# Patient Record
Sex: Female | Born: 1955 | Race: Black or African American | Hispanic: No | Marital: Single | Smoking: Never smoker
Health system: Southern US, Community
[De-identification: ages and names within clinical notes are randomized; demographics above are authoritative.]

## PROBLEM LIST (undated history)

## (undated) DIAGNOSIS — I10 Essential (primary) hypertension: Secondary | ICD-10-CM

## (undated) DIAGNOSIS — J45909 Unspecified asthma, uncomplicated: Secondary | ICD-10-CM

## (undated) DIAGNOSIS — G4730 Sleep apnea, unspecified: Secondary | ICD-10-CM

## (undated) DIAGNOSIS — T7840XA Allergy, unspecified, initial encounter: Secondary | ICD-10-CM

## (undated) DIAGNOSIS — K219 Gastro-esophageal reflux disease without esophagitis: Secondary | ICD-10-CM

## (undated) HISTORY — DX: Allergy, unspecified, initial encounter: T78.40XA

## (undated) HISTORY — DX: Unspecified asthma, uncomplicated: J45.909

## (undated) HISTORY — DX: Essential (primary) hypertension: I10

## (undated) HISTORY — DX: Sleep apnea, unspecified: G47.30

## (undated) HISTORY — PX: TUBAL LIGATION: SHX77

## (undated) HISTORY — PX: TONSILLECTOMY: SUR1361

## (undated) HISTORY — DX: Gastro-esophageal reflux disease without esophagitis: K21.9

---

## 1997-10-06 ENCOUNTER — Encounter: Payer: Self-pay | Admitting: Pulmonary Disease

## 1997-10-06 ENCOUNTER — Ambulatory Visit (HOSPITAL_COMMUNITY): Admission: RE | Admit: 1997-10-06 | Discharge: 1997-10-06 | Payer: Self-pay | Admitting: Pulmonary Disease

## 2003-10-12 ENCOUNTER — Emergency Department (HOSPITAL_COMMUNITY): Admission: EM | Admit: 2003-10-12 | Discharge: 2003-10-12 | Payer: Self-pay | Admitting: Emergency Medicine

## 2006-10-24 ENCOUNTER — Ambulatory Visit (HOSPITAL_COMMUNITY): Admission: RE | Admit: 2006-10-24 | Discharge: 2006-10-24 | Payer: Self-pay | Admitting: Cardiology

## 2007-01-30 ENCOUNTER — Encounter: Admission: RE | Admit: 2007-01-30 | Discharge: 2007-01-30 | Payer: Self-pay | Admitting: Cardiology

## 2007-02-10 ENCOUNTER — Encounter: Admission: RE | Admit: 2007-02-10 | Discharge: 2007-02-10 | Payer: Self-pay | Admitting: Obstetrics & Gynecology

## 2008-08-04 ENCOUNTER — Encounter: Admission: RE | Admit: 2008-08-04 | Discharge: 2008-08-04 | Payer: Self-pay | Admitting: Cardiology

## 2009-10-27 ENCOUNTER — Encounter (INDEPENDENT_AMBULATORY_CARE_PROVIDER_SITE_OTHER): Payer: Self-pay | Admitting: *Deleted

## 2009-10-30 ENCOUNTER — Ambulatory Visit: Payer: Self-pay | Admitting: Gastroenterology

## 2009-11-15 ENCOUNTER — Ambulatory Visit: Payer: Self-pay | Admitting: Gastroenterology

## 2009-11-20 ENCOUNTER — Encounter: Payer: Self-pay | Admitting: Gastroenterology

## 2010-02-27 NOTE — Letter (Signed)
Summary: [facility] Instructions  [facility]  [address] [place], [place] [address]   Phone: [phone]  Fax: [phone]       Sydney Tran    Jun 09, 1955    MRN: [id number]        Procedure Day Dorna Bloom:  [facility]  11/15/09     Arrival Time:  12:30PM     Procedure Time:  1:30PM     Location of Procedure:                    _ X_  [facility] (4th Floor)                       PREPARATION FOR COLONOSCOPY WITH MOVIPREP   Starting 5 days prior to your procedure 11/10/09 do not eat nuts, seeds, popcorn, corn, beans, peas,  salads, or any raw vegetables.  Do not take any fiber supplements (e.g. Metamucil, Citrucel, and Benefiber).  THE DAY BEFORE YOUR PROCEDURE         DATE: 11/14/09  DAY: TUESDAY  1.  Drink clear liquids the entire day-NO SOLID FOOD  2.  Do not drink anything colored red or purple.  Avoid juices with pulp.  No orange juice.  3.  Drink at least 64 oz. (8 glasses) of fluid/clear liquids during the day to prevent dehydration and help the prep work efficiently.  CLEAR LIQUIDS INCLUDE: Water Jello Ice Popsicles Tea (sugar ok, no milk/cream) Powdered fruit flavored drinks Coffee (sugar ok, no milk/cream) Gatorade Juice: apple, white grape, white cranberry  Lemonade Clear bullion, consomm, broth Carbonated beverages (any kind) Strained chicken noodle soup Hard Candy  4.  In the morning, mix first dose of MoviPrep solution:    Empty 1 Pouch A and 1 Pouch B into the disposable container    Add lukewarm drinking water to the top line of the container. Mix to dissolve    Refrigerate (mixed solution should be used within 24 hrs) 5.  Begin drinking the prep at 5:00 p.m. The MoviPrep container is divided by 4 marks.   Every 15 minutes drink the solution down to the next mark (approximately 8 oz) until the full liter is complete.   6.  Follow completed prep with 16 oz of clear liquid of your choice (Nothing red or purple).   Continue to drink clear liquids until bedtime.  7.  Before going to bed, mix second dose of MoviPrep solution:    Empty 1 Pouch A and 1 Pouch B into the disposable container    Add lukewarm drinking water to the top line of the container. Mix to dissolve    Refrigerate  THE DAY OF YOUR PROCEDURE      DATE: 11/15/09  DAY: WEDNESDAY  Beginning at 8:30AM (5 hours before procedure):         1. Every 15 minutes, drink the solution down to the next mark (approx 8 oz) until the full liter is complete.  2. Follow completed prep with 16 oz. of clear liquid of your choice.    3. You may drink clear liquids until 11:30AM (2 HOURS BEFORE PROCEDURE).   MEDICATION INSTRUCTIONS  Unless otherwise instructed, you should take regular prescription medications with a small sip of water   as early as possible the morning of your procedure.  .  Additional medication instructions: n/a         OTHER INSTRUCTIONS  You will need a responsible adult at least  56 years of age to accompany you and drive you home.   This person must remain in the waiting room during your procedure.  Wear loose fitting clothing that is easily removed.  Leave jewelry and other valuables at home.  However, you may wish to bring a book to read or  an iPod/MP3 player to listen to music as you wait for your procedure to start.  Remove all body piercing jewelry and leave at home.  Total time from sign-in until discharge is approximately 2-3 hours.  You should go home directly after your procedure and rest.  You can resume normal activities the  day after your procedure.  The day of your procedure you should not:   Drive   Make legal decisions   Operate machinery   Drink alcohol   Return to work  You will receive specific instructions about eating, activities and medications before you leave.    The above instructions have been reviewed and explained to me by   Sherren Kerns RN  October 30, 2009 10:15  AM   I fully understand and can verbalize these instructions _____________________________ Date _________

## 2010-02-27 NOTE — Procedures (Signed)
Summary: Colonoscopy  Patient: Sydney Tran Note: All result statuses are Final unless otherwise noted.  Tests: (1) Colonoscopy (COL)   COL Colonoscopy           DONE     [facility]     [address]     [place], [place]  [address]           COLONOSCOPY PROCEDURE REPORT           PATIENT:  Sydney Tran  MR#:  [id number]     BIRTHDATE:  13-Jul-1955, 54 yrs. old  GENDER:  female           ENDOSCOPIST:  Barbette Hair. Arlyce Dice, MD     Referred by:  Donia Guiles, M.D.           PROCEDURE DATE:  11/15/2009     PROCEDURE:  Colonoscopy with snare polypectomy     ASA CLASS:  Class I     INDICATIONS:  1) Routine Risk Screening           MEDICATIONS:   Fentanyl 100 mcg IV, Versed 10 mg IV           DESCRIPTION OF PROCEDURE:   After the risks benefits and     alternatives of the procedure were thoroughly explained, informed     consent was obtained.  Digital rectal exam was performed and     revealed no abnormalities.   The LB CF-[id number] endoscope     was introduced through the anus and advanced to the cecum, which     was identified by both the appendix and ileocecal valve, without     limitations.  The quality of the prep was excellent, using     MiraLax.  The instrument was then slowly withdrawn as the colon     was fully examined.     <<PROCEDUREIMAGES>>           FINDINGS:  A sessile polyp was found in the sigmoid colon. It was     3 mm in size. It was found 15 cm from the point of entry. Polyp     was snared without cautery. Retrieval was successful (see image16     and image17). snare polyp  This was otherwise a normal examination     of the colon (see image2, image3, image4, image6, image7, image10,     image11, image14, and image18).   Retroflexed views in the rectum     revealed no abnormalities.    The time to cecum =  1) 11.0     minutes. The scope was then withdrawn (time =  1) 4.0  min) from     the patient and the procedure completed.        COMPLICATIONS:  None           ENDOSCOPIC IMPRESSION:     1) 3 mm sessile polyp in the sigmoid colon     2) Otherwise normal examination     RECOMMENDATIONS:     1) If the polyp(s) removed today are proven to be adenomatous     (pre-cancerous) polyps, you will need a repeat colonoscopy in 5     years. Otherwise you should continue to follow colorectal cancer     screening guidelines for "routine risk" patients with colonoscopy     in 10 years.           REPEAT EXAM:   You will receive a letter from Dr. Arlyce Dice in  1-2     weeks, after reviewing the final pathology, with followup     recommendations.           ______________________________     Barbette Hair Arlyce Dice, MD           CC:           n.     eSIGNED:   Barbette Hair. Kaplan at 11/15/2009 02:21 PM           [name], [name], [id number]  Note: An exclamation mark (!) indicates a result that was not dispersed into the flowsheet. Document Creation Date: 11/15/2009 2:21 PM _______________________________________________________________________  (1) Order result status: Final Collection or observation date-time: 11/15/2009 14:15 Requested date-time:  Receipt date-time:  Reported date-time:  Referring Physician:   Ordering Physician: Melvia Heaps [phone]) Specimen Source:  Source: [name] Order Number: [id number] Lab site:   Appended Document: Colonoscopy     Procedures Next Due Date:    Colonoscopy: 10/2019

## 2010-02-27 NOTE — Letter (Signed)
Summary: Patient Notice-Hyperplastic Polyps  [facility]  [address] [place], [place] [address]   Phone: [phone]  Fax: [phone]        November 20, 2009 MRN: [id number]    [facility] [address] [place], [place]  [address]    Dear Ms. Babson,  I am pleased to inform you that the colon polyp(s) removed during your recent colonoscopy was (were) found to be hyperplastic.  These types of polyps are NOT pre-cancerous.  It is therefore my recommendation that you have a repeat colonoscopy examination in 10_ years for routine colorectal cancer screening.  Should you develop new or worsening symptoms of abdominal pain, bowel habit changes or bleeding from the rectum or bowels, please schedule an evaluation with either your primary care physician or with me.  Additional information/recommendations:  __No further action with gastroenterology is needed at this time.      Please follow-up with your primary care physician for your other      healthcare needs. __Please call [phone] to schedule a return visit to review      your situation.  __Please keep your follow-up visit as already scheduled.  _x_Continue treatment plan as outlined the day of your exam.  Please call us if you are having persistent problems or have questions about your condition that have not been fully answered at this time.  Sincerely,  Louis Meckel MD This letter has been electronically signed by your physician.  Appended Document: Patient Notice-Hyperplastic Polyps letter mailed

## 2010-02-27 NOTE — Miscellaneous (Signed)
Summary: previsit prep/rm  Clinical Lists Changes  Medications: Added new medication of MOVIPREP 100 GM  SOLR (PEG-KCL-NACL-NASULF-NA ASC-C) As per prep instructions. - Signed Rx of MOVIPREP 100 GM  SOLR (PEG-KCL-NACL-NASULF-NA ASC-C) As per prep instructions.;  #1 x 0;  Signed;  Entered by: Sherren Kerns RN;  Authorized by: Louis Meckel MD;  Method used: Electronically to [facility] [address] [place], [place], [place]  [address], Ph: [phone], Fax: [phone] Observations: Added new observation of ALLERGY REV: Done (10/30/2009 9:59) Added new observation of NKA: T (10/30/2009 9:59)    Prescriptions: MOVIPREP 100 GM  SOLR (PEG-KCL-NACL-NASULF-NA ASC-C) As per prep instructions.  #1 x 0   Entered by:   Sherren Kerns RN   Authorized by:   Louis Meckel MD   Signed by:   Sherren Kerns RN on 10/30/2009   Method used:   Electronically to        [facility] (retail)       [address]       [place], [place]  [address]       Ph: [phone]       Fax: [phone]   RxID:   [id number]

## 2011-04-16 ENCOUNTER — Encounter (INDEPENDENT_AMBULATORY_CARE_PROVIDER_SITE_OTHER): Payer: Managed Care, Other (non HMO) | Admitting: Obstetrics and Gynecology

## 2011-04-16 DIAGNOSIS — Z01419 Encounter for gynecological examination (general) (routine) without abnormal findings: Secondary | ICD-10-CM

## 2011-04-16 DIAGNOSIS — N390 Urinary tract infection, site not specified: Secondary | ICD-10-CM

## 2011-05-03 ENCOUNTER — Other Ambulatory Visit (INDEPENDENT_AMBULATORY_CARE_PROVIDER_SITE_OTHER): Payer: Managed Care, Other (non HMO)

## 2011-05-03 DIAGNOSIS — N390 Urinary tract infection, site not specified: Secondary | ICD-10-CM

## 2011-06-02 ENCOUNTER — Emergency Department (INDEPENDENT_AMBULATORY_CARE_PROVIDER_SITE_OTHER)
Admission: EM | Admit: 2011-06-02 | Discharge: 2011-06-02 | Disposition: A | Discharge status: Home / Self Care | Payer: Worker's Compensation | Source: Home / Self Care | Attending: Emergency Medicine | Admitting: Emergency Medicine

## 2011-06-02 ENCOUNTER — Encounter (HOSPITAL_COMMUNITY): Payer: Self-pay | Admitting: *Deleted

## 2011-06-02 DIAGNOSIS — Z579 Occupational exposure to unspecified risk factor: Secondary | ICD-10-CM

## 2011-06-02 DIAGNOSIS — Z569 Unspecified problems related to employment: Secondary | ICD-10-CM | POA: Diagnosis not present

## 2011-06-02 LAB — CBC
HCT: 42.7 % (ref 36.0–46.0)
MCHC: 34.2 g/dL (ref 30.0–36.0)
Platelets: 244 10*3/uL (ref 150–400)
RDW: 13.5 % (ref 11.5–15.5)
WBC: 6.2 10*3/uL (ref 4.0–10.5)

## 2011-06-02 LAB — POCT I-STAT, CHEM 8
BUN: 11 mg/dL (ref 6–23)
Chloride: 107 mEq/L (ref 96–112)
HCT: 43 % (ref 36.0–46.0)
Potassium: 3.9 mEq/L (ref 3.5–5.1)
Sodium: 141 mEq/L (ref 135–145)

## 2011-06-02 LAB — DIFFERENTIAL
Basophils Absolute: 0.1 10*3/uL (ref 0.0–0.1)
Basophils Relative: 1 % (ref 0–1)
Eosinophils Relative: 5 % (ref 0–5)
Lymphocytes Relative: 40 % (ref 12–46)
Monocytes Absolute: 0.5 10*3/uL (ref 0.1–1.0)
Neutro Abs: 3 10*3/uL (ref 1.7–7.7)

## 2011-06-02 NOTE — ED Provider Notes (Signed)
History     CSN: [id number]  Arrival date & time 06/02/11  1518   First MD Initiated Contact with Patient 06/02/11 1518      Chief Complaint  Patient presents with  . Laceration    (Consider location/radiation/quality/duration/timing/severity/associated sxs/prior treatment) HPI Comments: Patient sustained a laceration in the dorsal aspect of her left index finger about an hour ago while she was working. 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 She bled and immediately and irrigated her wound rapidly.  Has some throbbing discomfort in the distal index finger. Denies any distal weakness or numbness.  She was informed to be evaluated as at this particular point the corpus of the person she was working at this point she has no specific information or details about past medical history of this individual she was working on.  During evaluation a representative of her workplace came in with further information of the stating that at this point in time they know the patient is a known substance abuse individual that you're still in the [facility] and to obtain more information and they're working on confidentiality regulations and issues.  Patient is a 56 y.o. female presenting with skin laceration. The history is provided by the patient.  Laceration  The incident occurred less than 1 hour ago. The laceration is 1 cm in size. The laceration mechanism was a a clean knife. The pain is at a severity of 2/10. The pain is mild. She reports no foreign bodies present. Her tetanus status is unknown.    History reviewed. No pertinent past medical history.  History reviewed. No pertinent past surgical history.  History reviewed. No pertinent family history.  History  Substance Use Topics  . Smoking status: Not on file  . Smokeless tobacco: Not on file  .  Alcohol Use: Not on file    OB History    Grav Para Term Preterm Abortions TAB SAB Ect Mult Living                  Review of Systems  Constitutional: Negative for fever and appetite change.  Skin: Positive for wound.  Neurological: Negative for weakness and numbness.    Allergies  Review of patient's allergies indicates no known allergies.  Home Medications  No current outpatient prescriptions on file.  There were no vitals taken for this visit.  Physical Exam  Nursing note and vitals reviewed. Constitutional: She appears well-developed and well-nourished. No distress.  Eyes: Conjunctivae are normal.  Musculoskeletal:       Hands: Neurological: She is alert. No cranial nerve deficit.  Skin: No rash noted.    ED Course  Procedures (including critical care time)  Labs Reviewed  POCT I-STAT, CHEM 8 - Abnormal; Notable for the following:    Glucose, Bld 104 (*)    All other components within normal limits  CBC  DIFFERENTIAL  HEPATITIS PANEL, ACUTE  HIV ANTIBODY (ROUTINE TESTING)   No results found.   1. Occupational exposure in workplace       MDM  Patient sustained a laceration in the dorsal aspect of her left index finger about an hour ago while she was working. 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 She bled and immediately and irrigated her wound rapidly.    At this point in time we have obtained laboratory Baseline information as to determine if the patient has been exposed in the past for hepatitis C hepatitis B and HIV. She agreed to be tested and denies that she has been tested as long as she can remember. We discussed at length current recommendations about post exposure prophylaxis for HIV she declined to be treated at this point in time we advised her that typically most of the evidence points of been treated peripherally  within the first 36 hours patient decided to wait and will think about it tomorrow. Patient is aware that further testing is needed to occur at different intervals from today 3 weeks 6 weeks 3 months and 6 months to detect any potential circumflex person. She has discussed this issues with her management they will try to obtain further information as to determine if the patient is a known HIV infected or hepatitis C infected individual.    Patient instructed to call us tomorrow or return we will discuss this further and we will figure to our employee health for continuity of this post exposure. Vision 2 guidelines with her to read in detail   Jimmie Molly, MD 06/02/11 1958

## 2011-06-02 NOTE — Discharge Instructions (Signed)
We discuss current guidelines for post exposure prophylaxis for HIV. You opted to decline treatment at this point and will study parameters given to you take a decision in the morning. We also discussed important to try to make any necessary effort to obtain medical records from the source of exposure. We also discussed the need for you to be retested at specific intervals of time as described in provided to your

## 2011-06-02 NOTE — ED Notes (Signed)
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 approx 1/2 inch lac to finger

## 2011-06-03 ENCOUNTER — Telehealth (HOSPITAL_COMMUNITY): Payer: Self-pay | Admitting: *Deleted

## 2011-06-03 LAB — HEPATITIS PANEL, ACUTE
HCV Ab: NEGATIVE
Hep B C IgM: NEGATIVE
Hepatitis B Surface Ag: NEGATIVE

## 2011-06-03 LAB — HIV ANTIBODY (ROUTINE TESTING W REFLEX): HIV: NONREACTIVE

## 2011-06-03 NOTE — ED Notes (Addendum)
Pt. called and said she was supposed to call Dr. Ladon Applebaum back for a treatment plan.  Dr. Ladon Applebaum talked to pt. on the phone. He told me she does not want to start the HIV preventive medication yet. She is waiting for the medical examiner to tell her if the deceased has HIV.  She will need to f/u with Occupational Health.  He will leave her Rx.'s if she calls back after 1600. Vassie Moselle 06/03/2011

## 2011-06-03 NOTE — ED Notes (Signed)
Call from patient on answering machine, after examining chart, pt has found to have discussed issue with phone nurse, Cherly Anderson

## 2011-06-04 NOTE — ED Notes (Addendum)
Pt. came in for her lab results. Pt. verified with her picture ID and shown her neg. results. Acute Hepatitis panel all neg, HIV non-reactive. Pt. does not want to take the preventative drugs at this time because the medical examiner told her initial HIV screening of deceased was neg.  She was told the blood had degraded to much to do the Hepatitis testing. She was told by a family member that he had Hepatitis- did not know which one. Pt. is anxious and has not read her d/c instructions and could not remember what Dr. Ladon Applebaum told her about f/u testing. She also wants to know if there is preventive medication for Hepatitis.  She told me she has had the Hepatitis B vaccine. Her PCP is Dr. Shana Chute but he is out on leave. Has appointment today with Dr. Algie Coffer at 1500. She asked if she could keep seeing Dr. Ladon Applebaum. I told her no, he did the initial screening but she needs to f/u with PCP. I called Dr. Ladon Applebaum and left a message, to ask him about her other concerns. He called me back in 5 minutes.  He said pt. really needs to f/u with Employee/Occ. [facility] that her company uses.  They can do all the f/u testing and know exactly what to do.  He said to reassure pt. -don't worry, she will be fine.  He said the main concern is for HIV and that was neg., so she does not need the preventive drugs. She does need retested in 3 weeks, 3 mos., and 6 mos. and it can be done by [facility] [facility]. He suggested [facility] try to contact deceased's physician to find out what kind of Hepatitis he had. I gave all this information.  Pt.'s questions answered. Pt. reassured. Pt. wants copies of her labs. Pt. signed medical release form while I copied her picture ID. Pt. given copies of labs in a brown envelope. I told her if she decides to see Dr. Algie Coffer today to show him the labs. Vassie Moselle 06/04/2011 Pt. does not know which [facility] [facility] her company uses. Priscella Mann RN.

## 2013-07-13 ENCOUNTER — Other Ambulatory Visit: Payer: Self-pay | Admitting: Obstetrics and Gynecology

## 2013-07-13 DIAGNOSIS — Z1231 Encounter for screening mammogram for malignant neoplasm of breast: Secondary | ICD-10-CM

## 2013-07-22 ENCOUNTER — Ambulatory Visit
Admission: RE | Admit: 2013-07-22 | Discharge: 2013-07-22 | Disposition: A | Discharge status: Home / Self Care | Payer: Managed Care, Other (non HMO) | Source: Ambulatory Visit | Attending: Obstetrics and Gynecology | Admitting: Obstetrics and Gynecology

## 2013-07-22 DIAGNOSIS — Z1231 Encounter for screening mammogram for malignant neoplasm of breast: Secondary | ICD-10-CM

## 2013-10-22 ENCOUNTER — Encounter: Payer: Self-pay | Admitting: Gastroenterology

## 2014-01-04 ENCOUNTER — Other Ambulatory Visit: Payer: Self-pay | Admitting: Cardiology

## 2014-01-04 DIAGNOSIS — R1011 Right upper quadrant pain: Secondary | ICD-10-CM

## 2014-01-06 ENCOUNTER — Other Ambulatory Visit: Payer: Self-pay | Admitting: Cardiology

## 2014-01-06 ENCOUNTER — Ambulatory Visit
Admission: RE | Admit: 2014-01-06 | Discharge: 2014-01-06 | Disposition: A | Discharge status: Home / Self Care | Payer: Managed Care, Other (non HMO) | Source: Ambulatory Visit | Attending: Cardiology | Admitting: Cardiology

## 2014-01-06 DIAGNOSIS — R1011 Right upper quadrant pain: Secondary | ICD-10-CM

## 2016-02-20 ENCOUNTER — Ambulatory Visit
Admission: RE | Admit: 2016-02-20 | Discharge: 2016-02-20 | Disposition: A | Discharge status: Home / Self Care | Payer: Managed Care, Other (non HMO) | Source: Ambulatory Visit | Attending: Cardiology | Admitting: Cardiology

## 2016-02-20 ENCOUNTER — Other Ambulatory Visit: Payer: Self-pay | Admitting: Cardiology

## 2016-02-20 DIAGNOSIS — R0600 Dyspnea, unspecified: Secondary | ICD-10-CM

## 2016-03-15 ENCOUNTER — Ambulatory Visit (INDEPENDENT_AMBULATORY_CARE_PROVIDER_SITE_OTHER): Payer: Managed Care, Other (non HMO) | Admitting: Internal Medicine

## 2016-03-15 ENCOUNTER — Encounter: Payer: Self-pay | Admitting: Internal Medicine

## 2016-03-15 VITALS — BP 122/78 | HR 69

## 2016-03-15 DIAGNOSIS — R05 Cough: Secondary | ICD-10-CM | POA: Diagnosis not present

## 2016-03-15 DIAGNOSIS — R058 Other specified cough: Secondary | ICD-10-CM

## 2016-03-15 MED ORDER — FAMOTIDINE 20 MG PO TABS: ORAL_TABLET | ORAL | 2 refills | Status: DC

## 2016-03-15 MED ORDER — BENZONATATE 200 MG PO CAPS: 200.0000 mg | ORAL_CAPSULE | Freq: Three times a day (TID) | ORAL | 1 refills | Status: DC | PRN

## 2016-03-15 MED ORDER — PANTOPRAZOLE SODIUM 40 MG PO TBEC: 40.0000 mg | DELAYED_RELEASE_TABLET | Freq: Every day | ORAL | 2 refills | Status: DC

## 2016-03-15 NOTE — Patient Instructions (Addendum)
Pantoprazole (protonix) 40 mg   Take  30-60 min before first meal of the day and Pepcid (famotidine)  20 mg one @  bedtime until return to office - this is the best way to tell whether stomach acid is contributing to your problem.    GERD (REFLUX)  is an extremely common cause of respiratory symptoms just like yours , many times with no obvious heartburn at all.    It can be treated with medication, but also with lifestyle changes including elevation of the head of your bed (ideally with 6 inch  bed blocks),  Smoking cessation, avoidance of late meals, excessive alcohol, and avoid fatty foods, chocolate, peppermint, colas, red wine, and acidic juices such as orange juice.  NO MINT OR MENTHOL PRODUCTS SO NO COUGH DROPS   USE SUGARLESS CANDY INSTEAD (Jolley ranchers or Stover's or Life Savers) or even ice chips will also do - the key is to swallow to prevent all throat clearing. NO OIL BASED VITAMINS - use powdered substitutes.  For cough > tessilon 200 mg three times daily as needed     Please schedule a follow up office visit in 4 weeks, sooner if needed with all meds in hand

## 2016-03-15 NOTE — Progress Notes (Signed)
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 Pulmonary Consult    referred by Dr. Shana Chute, started in January, went to a urgent care for treatment for URI and bronchitis, it helped but still didnt go away completely, xray showed air in her lungs, dyspnea when coughing, her voice gets hoarse  sleeps ok/ feels rested in am no excess mucus but during the day doe  X steps Assoc with hoarseness and loss of breath at rest x few secs at rest then resolves "like getting choked"  No obvious day to day or daytime variability or assoc excess/ purulent sputum or mucus plugs or hemoptysis or cp or chest tightness, subjective wheeze or overt sinus or hb symptoms. No unusual exp hx or h/o childhood pna/ asthma or knowledge of premature birth.  Sleeping ok without nocturnal  or early am exacerbation  of respiratory  c/o's or need for noct saba. Also denies any obvious fluctuation of symptoms with weather or environmental changes or other aggravating or alleviating factors except as outlined above   Current Medications, Allergies, Complete Past Medical History, Past Surgical History, Family History, and Social History were reviewed in [employer] record.          Review of Systems  Constitutional: Negative.  Negative for fever and unexpected weight change.  HENT: Negative.  Negative for congestion, dental problem, ear pain, nosebleeds, postnasal drip, rhinorrhea, sinus pressure,  sneezing, sore throat and trouble swallowing.   Eyes: Negative.  Negative for redness and itching.  Respiratory: Positive for cough and shortness of breath. Negative for chest tightness and wheezing.   Cardiovascular: Negative.  Negative for palpitations and leg swelling.  Gastrointestinal: Negative.  Negative for nausea and vomiting.  Genitourinary: Negative.  Negative for dysuria.  Musculoskeletal: Negative.  Negative for joint swelling.  Skin: Negative.  Negative for rash.  Neurological: Negative.  Negative for headaches.  Hematological: Negative.  Does not bruise/bleed easily.  Psychiatric/Behavioral: Negative.  Negative for dysphoric mood. The patient is not nervous/anxious.        Objective:   Physical Exam  Very hoarse amb wf with freq throat clearing  Wt Readings from Last 3 Encounters:  No data found for Wt    Vital signs reviewed  - Note on arrival 02 sats  95% on RA    HEENT: nl dentition, turbinates bilaterally, and oropharynx. Nl external ear canals without cough reflex   NECK :  without JVD/Nodes/TM/ nl carotid upstrokes bilaterally   LUNGS: no acc muscle use,  Nl contour chest which is clear to A and P bilaterally without cough on insp or exp maneuvers   CV:  RRR  no s3 or murmur or increase in P2, and no edema   ABD:  soft and nontender with nl inspiratory excursion in the supine position. No bruits or organomegaly appreciated, bowel sounds nl  MS:  Nl gait/ ext warm without deformities, calf tenderness, cyanosis or clubbing No obvious joint restrictions   SKIN: warm and dry without lesions    NEURO:  alert, approp, nl sensorium with  no motor or cerebellar deficits apparent.      I personally reviewed images and agree with radiology impression as follows:  CXR:   02/20/16  Hyperinflation consistent with COPD or reactive airway disease. There is no pneumonia nor other acute cardiopulmonary abnormality.         Assessment & Plan:

## 2016-03-17 ENCOUNTER — Encounter: Payer: Self-pay | Admitting: Internal Medicine

## 2016-03-17 NOTE — Assessment & Plan Note (Signed)
Spirometry 03/15/2016  wnl including f/v contour off rx  - 03/15/2016 max gerd rx >>>  The most common causes of chronic cough in immunocompetent adults include the following: upper airway cough syndrome (UACS), previously referred to as postnasal drip syndrome (PNDS), which is caused by variety of rhinosinus conditions; (2) asthma; (3) GERD; (4) chronic bronchitis from cigarette smoking or other inhaled environmental irritants; (5) nonasthmatic eosinophilic bronchitis; and (6) bronchiectasis.   These conditions, singly or in combination, have accounted for up to 94% of the causes of chronic cough in prospective studies.   Other conditions have constituted no >6% of the causes in prospective studies These have included bronchogenic carcinoma, chronic interstitial pneumonia, sarcoidosis, left ventricular failure, ACEI-induced cough, and aspiration from a condition associated with pharyngeal dysfunction.    Chronic cough is often simultaneously caused by more than one condition. A single cause has been found from 38 to 82% of the time, multiple causes from 18 to 62%. Multiply caused cough has been the result of three diseases up to 42% of the time.      Most likely this is Upper airway cough syndrome (previously labeled PNDS) , is  so named because it's frequently impossible to sort out how much is  CR/sinusitis with freq throat clearing (which can be related to primary GERD)   vs  causing  secondary (" extra esophageal")  GERD from wide swings in gastric pressure that occur with throat clearing, often  promoting self use of mint and menthol lozenges that reduce the lower esophageal sphincter tone and exacerbate the problem further in a cyclical fashion.   These are the same pts (now being labeled as having "irritable larynx syndrome" by some cough centers) who not infrequently have a history of having failed to tolerate ace inhibitors,  dry powder inhalers or biphosphonates or report having  atypical/extraesophageal reflux symptoms that don't respond to standard doses of PPI  and are easily confused as having aecopd or asthma flares by even experienced allergists/ pulmonologists (myself included).  Of the three most common causes of chronic cough, only one (GERD)  can actually cause the other two (asthma and post nasal drip syndrome)  and perpetuate the cylce of cough inducing airway trauma, inflammation, heightened sensitivity to reflux which is prompted by the cough itself via a cyclical mechanism.    This may partially respond to steroids and look like asthma and post nasal drainage but never erradicated completely unless the cough and the secondary reflux are eliminated, preferably both at the same time.  While not intuitively obvious, many patients with chronic low grade reflux do not cough until there is a secondary insult that disturbs the protective epithelial barrier and exposes sensitive nerve endings.  This can be viral as was likely the scenario here  or direct physical injury such as with an endotracheal tube.   The point is that once this occurs, it is difficult to eliminate using anything but a maximally effective acid suppression regimen at least in the short run, accompanied by an appropriate diet to address non acid GERD.  Will try max rx for gerd and try to eliminate throat clearing with hard candy/ tessalon   Total time devoted to counseling  > 50 % of initial 60 min office visit:  review case with pt/ discussion of options/alternatives/ personally creating written customized instructions  in presence of pt  then going over those specific  Instructions directly with the pt including how to use all of the meds but  in particular covering each new medication in detail and the difference between the maintenance= "automatic" meds and the prns using an action plan format for the latter (If this problem/symptom => do that organization reading Left to right).  Please see AVS from  this visit for a full list of these instructions which I personally wrote for this pt and  are unique to this visit.

## 2016-04-12 ENCOUNTER — Telehealth: Payer: Self-pay | Admitting: Internal Medicine

## 2016-04-12 ENCOUNTER — Encounter: Payer: Self-pay | Admitting: Internal Medicine

## 2016-04-12 ENCOUNTER — Other Ambulatory Visit: Payer: Self-pay | Admitting: Internal Medicine

## 2016-04-12 ENCOUNTER — Ambulatory Visit (INDEPENDENT_AMBULATORY_CARE_PROVIDER_SITE_OTHER): Payer: Managed Care, Other (non HMO) | Admitting: Internal Medicine

## 2016-04-12 ENCOUNTER — Other Ambulatory Visit (INDEPENDENT_AMBULATORY_CARE_PROVIDER_SITE_OTHER): Payer: Managed Care, Other (non HMO)

## 2016-04-12 VITALS — BP 126/72 | HR 66 | Ht 66.0 in | Wt 185.0 lb

## 2016-04-12 DIAGNOSIS — R0609 Other forms of dyspnea: Secondary | ICD-10-CM | POA: Diagnosis not present

## 2016-04-12 DIAGNOSIS — R058 Other specified cough: Secondary | ICD-10-CM

## 2016-04-12 DIAGNOSIS — R05 Cough: Secondary | ICD-10-CM | POA: Diagnosis not present

## 2016-04-12 LAB — CBC WITH DIFFERENTIAL/PLATELET
BASOS PCT: 2.1 % (ref 0.0–3.0)
Basophils Absolute: 0.1 10*3/uL (ref 0.0–0.1)
EOS ABS: 0.2 10*3/uL (ref 0.0–0.7)
EOS PCT: 4.6 % (ref 0.0–5.0)
HCT: 41.6 % (ref 36.0–46.0)
HEMOGLOBIN: 14 g/dL (ref 12.0–15.0)
LYMPHS PCT: 37.5 % (ref 12.0–46.0)
Lymphs Abs: 1.7 10*3/uL (ref 0.7–4.0)
MCHC: 33.8 g/dL (ref 30.0–36.0)
MCV: 86.3 fl (ref 78.0–100.0)
MONOS PCT: 10.5 % (ref 3.0–12.0)
Monocytes Absolute: 0.5 10*3/uL (ref 0.1–1.0)
NEUTROS PCT: 45.3 % (ref 43.0–77.0)
Neutro Abs: 2 10*3/uL (ref 1.4–7.7)
Platelets: 266 10*3/uL (ref 150.0–400.0)
RBC: 4.82 Mil/uL (ref 3.87–5.11)
RDW: 14 % (ref 11.5–15.5)
WBC: 4.4 10*3/uL (ref 4.0–10.5)

## 2016-04-12 LAB — NITRIC OXIDE: Nitric Oxide: 10

## 2016-04-12 MED ORDER — GABAPENTIN 100 MG PO CAPS: 100.0000 mg | ORAL_CAPSULE | Freq: Three times a day (TID) | ORAL | 2 refills | Status: DC

## 2016-04-12 MED ORDER — PREDNISONE 10 MG PO TABS: ORAL_TABLET | ORAL | 0 refills | Status: DC

## 2016-04-12 NOTE — Patient Instructions (Addendum)
Continue Pantoprazole (protonix) 40 mg   Take  30-60 min before first meal of the day and Pepcid (famotidine)  20 mg one @  bedtime until return to office - this is the best way to tell whether stomach acid is contributing to your problem.    Prednisone 10 mg take  4 each am x 2 days,   2 each am x 2 days,  1 each am x 2 days and stop and if not resolved start  gabapentin 100 mg three times a day with meals   GERD (REFLUX)  is an extremely common cause of respiratory symptoms just like yours , many times with no obvious heartburn at all.    It can be treated with medication, but also with lifestyle changes including elevation of the head of your bed (ideally with 6 inch  bed blocks),  Smoking cessation, avoidance of late meals, excessive alcohol, and avoid fatty foods, chocolate, peppermint, colas, red wine, and acidic juices such as orange juice.  NO MINT OR MENTHOL PRODUCTS SO NO COUGH DROPS   USE SUGARLESS CANDY INSTEAD (Jolley ranchers or Stover's or Life Savers) or even ice chips will also do - the key is to swallow to prevent all throat clearing. NO OIL BASED VITAMINS - use powdered substitutes.    Please remember to go to the lab   department downstairs in the basement  for your tests - we will call you with the results when they are available.  Let us know the name of the inhaler that you've been taking ASAP but  try off it for now    Please schedule a follow up office visit in 4 weeks, sooner if needed with all active meds in hand with option of referral [facility]

## 2016-04-12 NOTE — Progress Notes (Signed)
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 Pulmonary Consult    referred by Dr. Shana Tran, started in January, went to a urgent care for treatment for URI and bronchitis, it helped but still didnt go away completely, xray showed air in her lungs, dyspnea when coughing, her voice gets hoarse  sleeps ok/ feels rested in am no excess mucus but during the day doe  X steps [employer] with hoarseness and loss of breath at rest x few secs at rest then resolves "like getting choked" rec Pantoprazole (protonix) 40 mg   Take  30-60 min before first meal of the day and Pepcid (famotidine)  20 mg one @  bedtime until return to office - this is the best way to tell whether stomach acid is contributing to your problem.   GERD  For cough > tessilon 200 mg three times daily as needed  Please schedule a follow up office visit in 4 weeks, sooner if needed with all meds in hand     04/12/2016  f/u ov/Sydney Tran re: cough x 3 months - did not bring all meds as requested  Chief Complaint  Patient presents with  . Follow-up    pt reports she is not coughing as much as she was,   wakes up several times a week with a cough around 2-3 am but resolves s rx  Every day aware of cough at some point but there is not pattern she cam discern/ no definite resp to "inhaler" which she could not identify Every time she goes up steps at work  she is sob    No obvious day to day or daytime variability or assoc  excess/ purulent sputum or mucus plugs or hemoptysis or cp or chest tightness, subjective wheeze or overt sinus or hb symptoms. No unusual exp hx or h/o childhood pna/ asthma or knowledge of premature birth.  Sleeping ok without nocturnal  or early am exacerbation  of respiratory  c/o's or need for noct saba. Also denies any obvious fluctuation of symptoms with weather or environmental changes or other aggravating or alleviating factors except as outlined above   Current Medications, Allergies, Complete Past Medical History, Past Surgical History, Family History, and Social History were reviewed in [employer] record.  ROS  The following are not active complaints unless bolded sore throat, dysphagia, dental problems, itching, sneezing,  nasal congestion or excess/ purulent secretions, ear ache,   fever, chills, sweats, unintended wt loss, classically pleuritic or exertional cp,  orthopnea pnd or leg swelling, presyncope, palpitations, abdominal pain, anorexia, nausea, vomiting, diarrhea  or change in bowel or bladder habits, change in stools or urine, dysuria,hematuria,  rash, arthralgias, visual complaints, headache, numbness, weakness or ataxia or problems with walking or coordination,  change in mood/affect or memory.  Objective:   Physical Exam  Very hoarse amb bf with ongoing classic voice fatigue to point of almost sounding choked   Wt Readings from Last 3 Encounters:  04/12/16 185 lb (83.9 kg)    Vital signs reviewed  - Note on arrival 02 sats  97% on RA      HEENT: nl dentition, turbinates bilaterally, and oropharynx. Nl external ear canals without cough reflex   NECK :  without JVD/Nodes/TM/ nl carotid upstrokes bilaterally   LUNGS: no acc muscle use,  Nl contour chest which is clear to A and P bilaterally without cough on insp or exp maneuvers   CV:  RRR  no s3 or murmur or increase in P2, and no edema   ABD:  soft and  nontender with nl inspiratory excursion in the supine position. No bruits or organomegaly appreciated, bowel sounds nl  MS:  Nl gait/ ext warm without deformities, calf tenderness, cyanosis or clubbing No obvious joint restrictions   SKIN: warm and dry without lesions    NEURO:  alert, approp, nl sensorium with  no motor or cerebellar deficits apparent.      I personally reviewed images and agree with radiology impression as follows:  CXR:   02/20/16  Hyperinflation consistent with COPD or reactive airway disease. There is no pneumonia nor other acute cardiopulmonary abnormality.   Labs ordered 04/12/2016  Allergy profile       Assessment & Plan:

## 2016-04-12 NOTE — Telephone Encounter (Signed)
Per MW- pt was to call us with the name of her inhaler  LMTCB

## 2016-04-14 DIAGNOSIS — R0609 Other forms of dyspnea: Secondary | ICD-10-CM

## 2016-04-14 NOTE — Assessment & Plan Note (Signed)
04/12/2016  Walked RA x 3 laps @ 185 ft each stopped due to end of study/ very fast pace, no desat/ sob at end   Strongly suspect this is all uacs (see separate a/p)   Asked her to contact us with name of inhaler that "doesn't work" as asked her to stop it

## 2016-04-14 NOTE — Assessment & Plan Note (Addendum)
Spirometry 03/15/2016  wnl including f/v contour off rx  - 03/15/2016 max gerd rx > some better 04/12/2016   - Allergy profile 04/12/2016 >  Eos 0.2 /  IgE  Pending - FENO 04/12/2016  =   10  So rx pred x 6 days and if not better gabapentin 100 tid   Classic voice fatigue only marginally better with rx for gerd typical of Upper airway cough syndrome (previously labeled PNDS) , is  so named because it's frequently impossible to sort out how much is  CR/sinusitis with freq throat clearing (which can be related to primary GERD)   vs  causing  secondary (" extra esophageal")  GERD from wide swings in gastric pressure that occur with throat clearing, often  promoting self use of mint and menthol lozenges that reduce the lower esophageal sphincter tone and exacerbate the problem further in a cyclical fashion.   These are the same pts (now being labeled as having "irritable larynx syndrome" by some cough centers) who not infrequently have a history of having failed to tolerate ace inhibitors,  dry powder inhalers or biphosphonates or report having atypical/extraesophageal reflux symptoms that don't respond to standard doses of PPI  and are easily confused as having aecopd or asthma flares by even experienced allergists/ pulmonologists (myself included).   Strongly doubt allergy/ asthma component based on hx and low feno  but reasonable to try pred x 6 days and if not better either trial of gabapentin or [facility].

## 2016-04-15 LAB — RESPIRATORY ALLERGY PROFILE REGION II ~~LOC~~
Allergen, Cedar tree, t12: <0.1 kU/L
Allergen, Comm Silver Birch, t9: <0.1 kU/L
Allergen, Cottonwood, t14: <0.1 kU/L
Allergen, Mouse Urine Protein, e78: <0.1 kU/L
Allergen, Oak,t7: <0.1 kU/L
Allergen, P. notatum, m1: <0.1 kU/L
Aspergillus fumigatus, m3: <0.1 kU/L
Bermuda Grass: <0.1 kU/L
Cat Dander: <0.1 kU/L
Common Ragweed: <0.1 kU/L
ELM IGE: 0.13 kU/L — AB
IgE (Immunoglobulin E), Serum: 48 kU/L (ref <115)
Pecan/Hickory Tree IgE: 2.03 kU/L — ABNORMAL HIGH
Timothy Grass: <0.1 kU/L

## 2016-04-15 NOTE — Telephone Encounter (Signed)
Spoke with the pt The name of the inhaler is Flovent 110 mcg  She is asking about her lab results Please advise thanks

## 2016-04-15 NOTE — Telephone Encounter (Signed)
Spoke with the pt  She states she can not find her inhaler  She states she is going to go look in the car and then call us back

## 2016-04-16 NOTE — Telephone Encounter (Signed)
Lab results given to the pt  See result note

## 2016-04-16 NOTE — Progress Notes (Signed)
Spoke with pt and notified of results per Dr. Wert. Pt verbalized understanding and denied any questions. 

## 2016-05-10 ENCOUNTER — Ambulatory Visit: Payer: Self-pay | Admitting: Internal Medicine

## 2017-05-19 DIAGNOSIS — Z113 Encounter for screening for infections with a predominantly sexual mode of transmission: Secondary | ICD-10-CM | POA: Diagnosis not present

## 2017-05-19 DIAGNOSIS — K219 Gastro-esophageal reflux disease without esophagitis: Secondary | ICD-10-CM | POA: Diagnosis not present

## 2017-05-19 DIAGNOSIS — J040 Acute laryngitis: Secondary | ICD-10-CM | POA: Diagnosis not present

## 2017-05-19 DIAGNOSIS — Z01419 Encounter for gynecological examination (general) (routine) without abnormal findings: Secondary | ICD-10-CM | POA: Diagnosis not present

## 2017-05-20 ENCOUNTER — Ambulatory Visit (HOSPITAL_COMMUNITY)
Admission: EM | Admit: 2017-05-20 | Discharge: 2017-05-20 | Disposition: A | Discharge status: Home / Self Care | Payer: Self-pay

## 2017-11-06 DIAGNOSIS — Z23 Encounter for immunization: Secondary | ICD-10-CM | POA: Diagnosis not present

## 2017-12-08 DIAGNOSIS — R0600 Dyspnea, unspecified: Secondary | ICD-10-CM | POA: Diagnosis not present

## 2017-12-08 DIAGNOSIS — E663 Overweight: Secondary | ICD-10-CM | POA: Diagnosis not present

## 2018-01-05 DIAGNOSIS — R0600 Dyspnea, unspecified: Secondary | ICD-10-CM | POA: Diagnosis not present

## 2018-03-06 ENCOUNTER — Other Ambulatory Visit: Payer: Self-pay | Admitting: Internal Medicine

## 2018-03-06 ENCOUNTER — Ambulatory Visit
Admission: RE | Admit: 2018-03-06 | Discharge: 2018-03-06 | Disposition: A | Discharge status: Home / Self Care | Payer: BLUE CROSS/BLUE SHIELD | Source: Ambulatory Visit | Attending: Internal Medicine | Admitting: Internal Medicine

## 2018-03-06 DIAGNOSIS — M25519 Pain in unspecified shoulder: Secondary | ICD-10-CM | POA: Diagnosis not present

## 2018-03-06 DIAGNOSIS — M19011 Primary osteoarthritis, right shoulder: Secondary | ICD-10-CM | POA: Diagnosis not present

## 2018-03-06 DIAGNOSIS — M25511 Pain in right shoulder: Secondary | ICD-10-CM

## 2018-03-06 IMAGING — DX DG SHOULDER 2+V*R*
3 series · 3 of 3 positions shown · non-contrast
Comparison: None.

CLINICAL DATA: Right shoulder pain

EXAM:
RIGHT SHOULDER - 2+ VIEW

[dg shoulder right (1 of 3)]
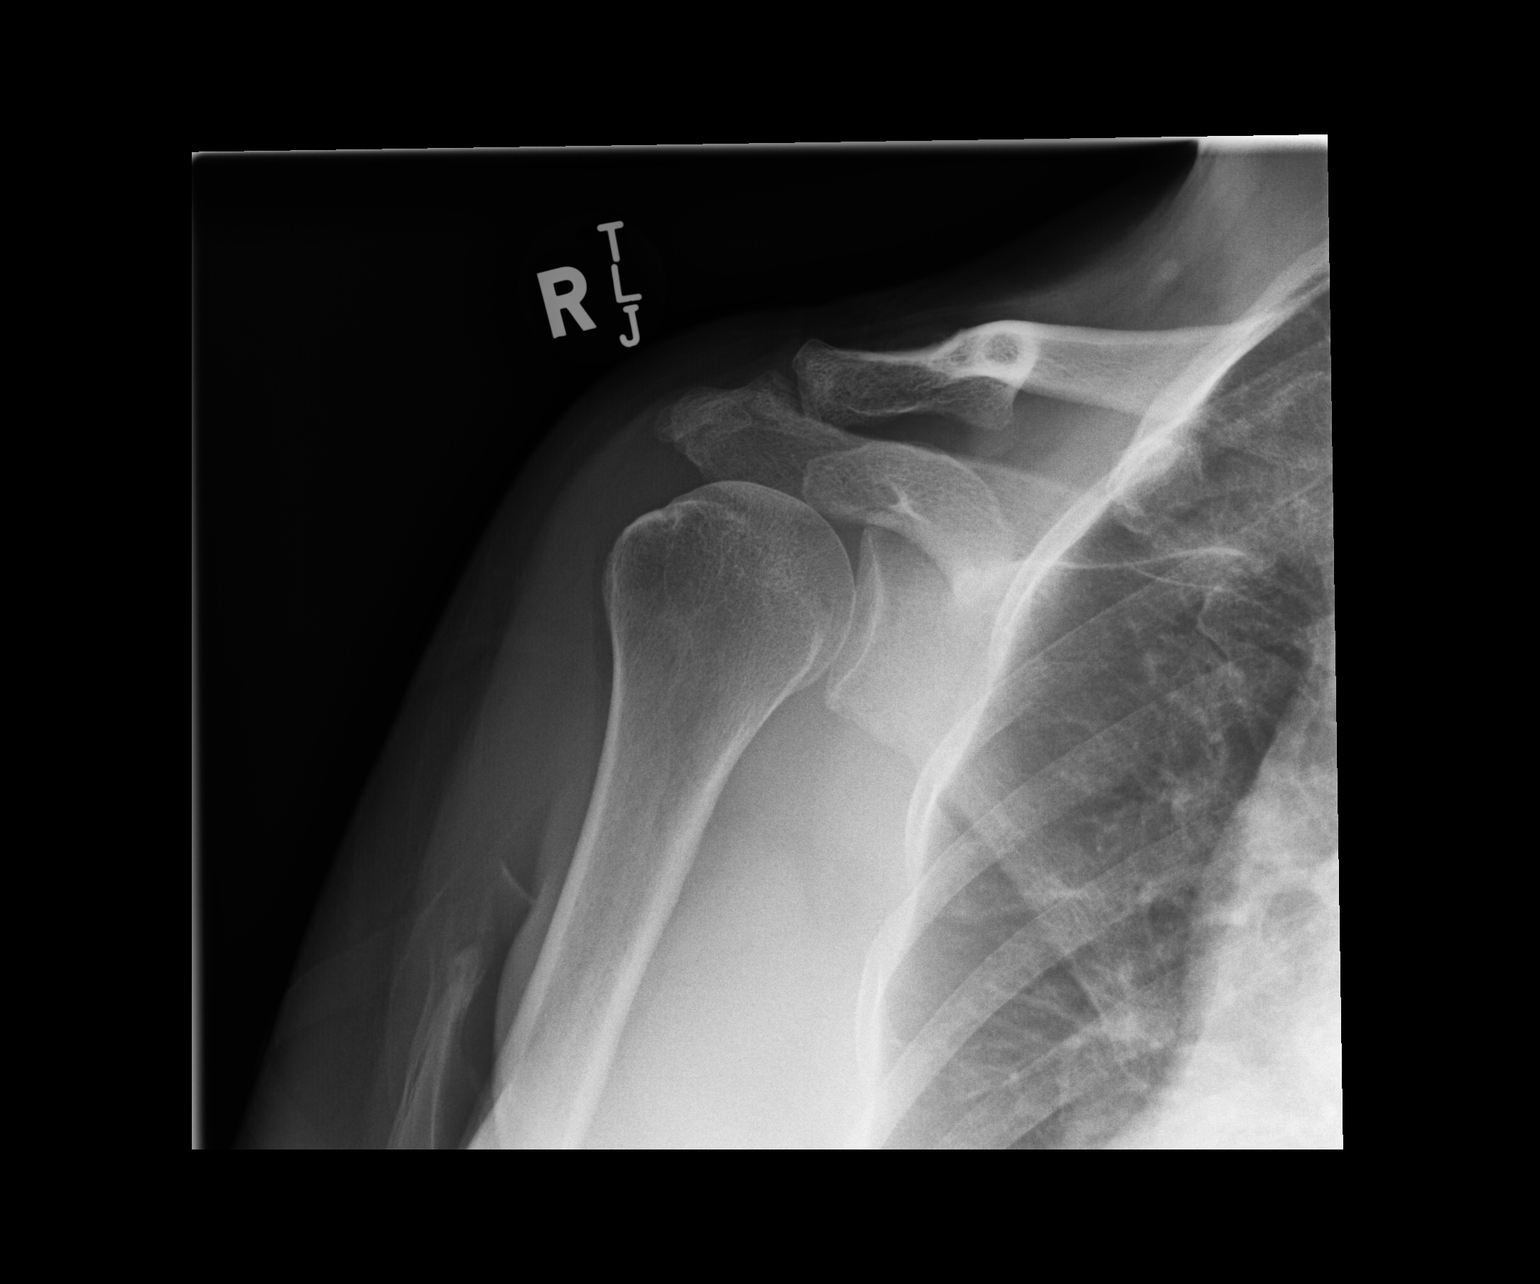

[dg shoulder right (2 of 3)]
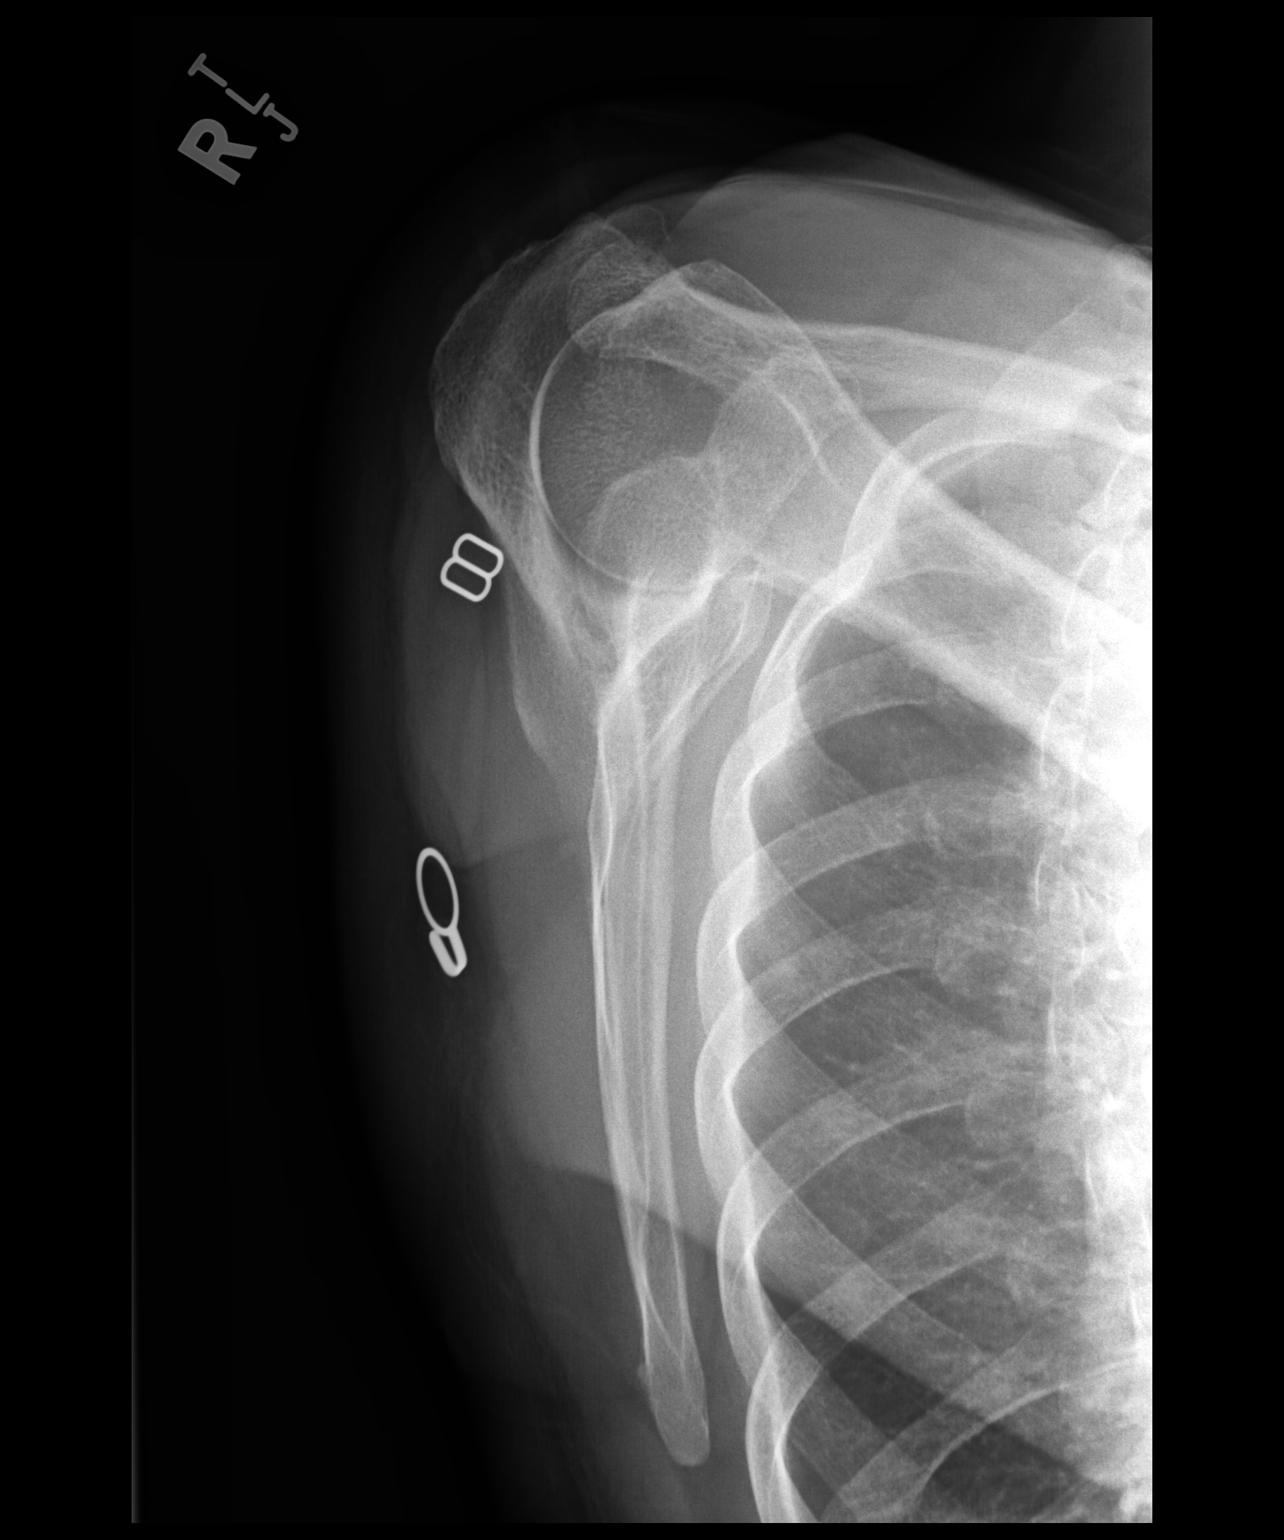

[dg shoulder right (3 of 3)]
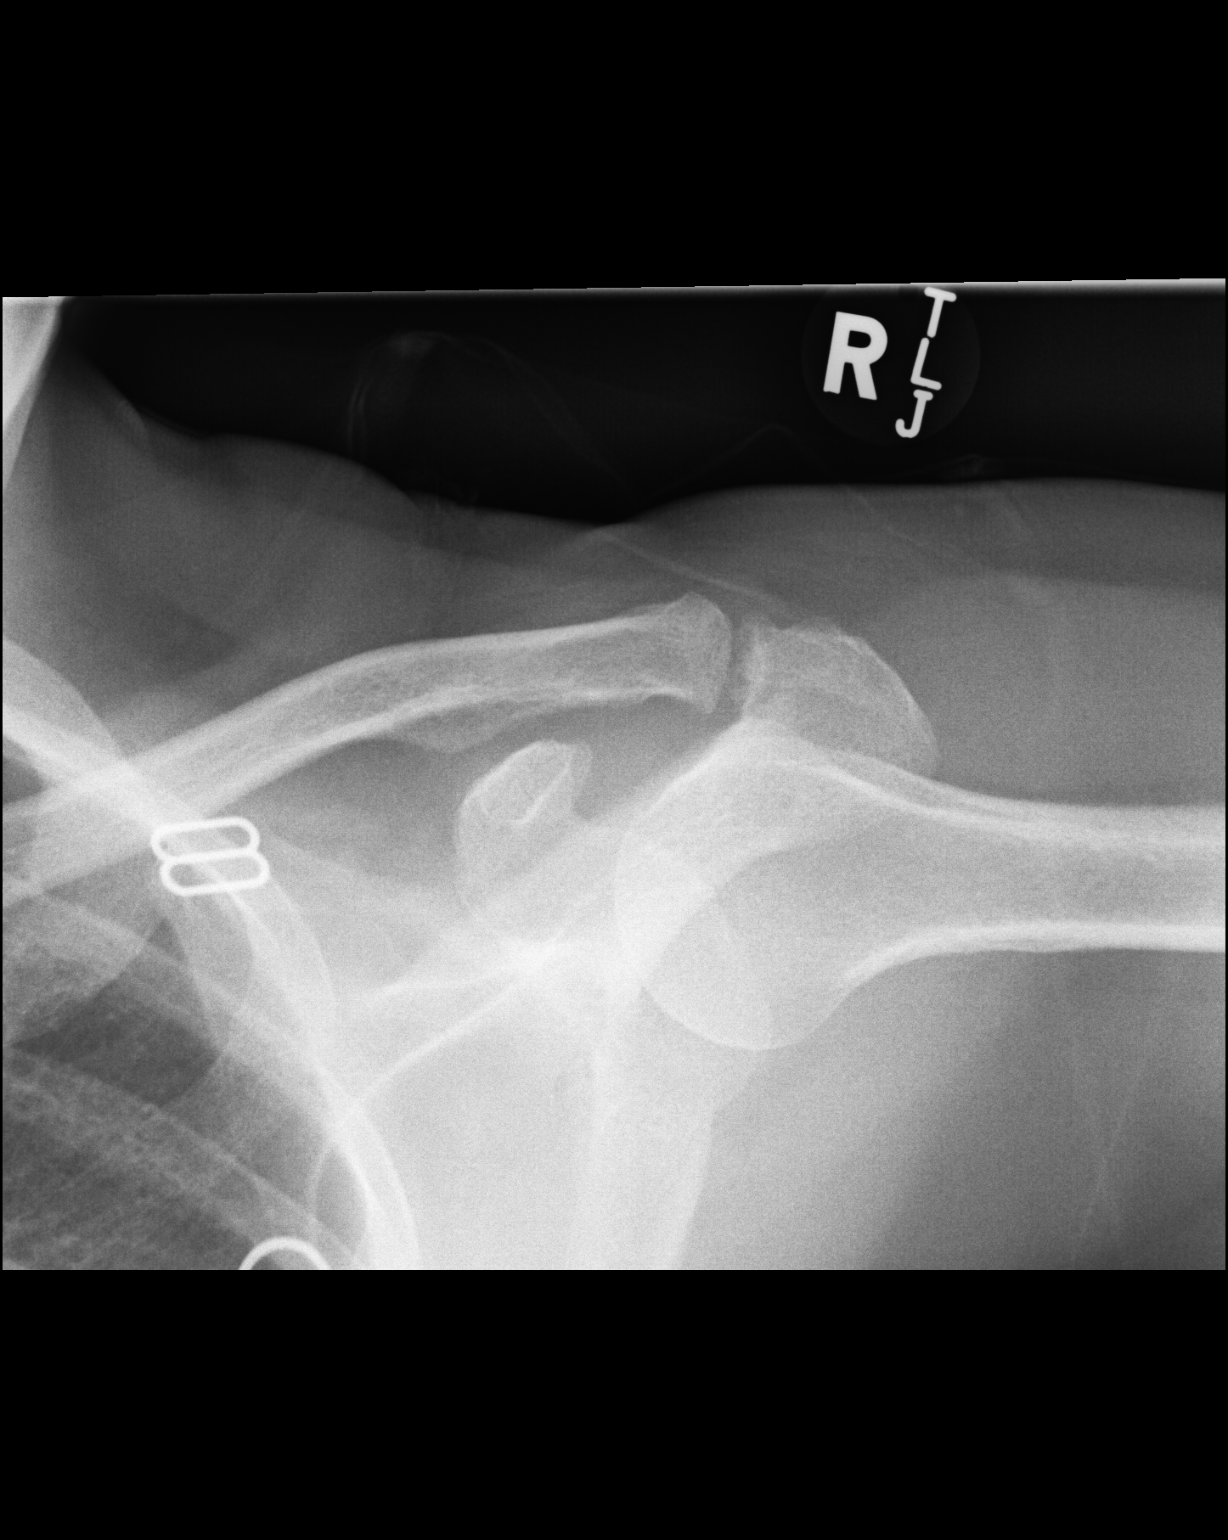

[3 of 3 positions shown; findings below may reference images not displayed]

FINDINGS: Degenerative changes in the AC joint with joint space narrowing and
spurring. Glenohumeral joint is maintained. No acute bony
abnormality. Specifically, no fracture, subluxation, or dislocation.
Soft tissues are intact.
IMPRESSION: Moderate degenerative changes in the right AC joint. No acute bony
abnormality.

## 2018-03-12 DIAGNOSIS — M7541 Impingement syndrome of right shoulder: Secondary | ICD-10-CM | POA: Diagnosis not present

## 2018-03-16 DIAGNOSIS — M25511 Pain in right shoulder: Secondary | ICD-10-CM | POA: Diagnosis not present

## 2018-03-16 DIAGNOSIS — S46011D Strain of muscle(s) and tendon(s) of the rotator cuff of right shoulder, subsequent encounter: Secondary | ICD-10-CM | POA: Diagnosis not present

## 2018-03-24 DIAGNOSIS — M25511 Pain in right shoulder: Secondary | ICD-10-CM | POA: Diagnosis not present

## 2018-03-24 DIAGNOSIS — S46011D Strain of muscle(s) and tendon(s) of the rotator cuff of right shoulder, subsequent encounter: Secondary | ICD-10-CM | POA: Diagnosis not present

## 2018-03-31 DIAGNOSIS — S46011D Strain of muscle(s) and tendon(s) of the rotator cuff of right shoulder, subsequent encounter: Secondary | ICD-10-CM | POA: Diagnosis not present

## 2018-03-31 DIAGNOSIS — M25511 Pain in right shoulder: Secondary | ICD-10-CM | POA: Diagnosis not present

## 2018-04-07 DIAGNOSIS — M25511 Pain in right shoulder: Secondary | ICD-10-CM | POA: Diagnosis not present

## 2018-04-07 DIAGNOSIS — S46011D Strain of muscle(s) and tendon(s) of the rotator cuff of right shoulder, subsequent encounter: Secondary | ICD-10-CM | POA: Diagnosis not present

## 2018-04-08 DIAGNOSIS — L308 Other specified dermatitis: Secondary | ICD-10-CM | POA: Diagnosis not present

## 2018-04-14 DIAGNOSIS — M25511 Pain in right shoulder: Secondary | ICD-10-CM | POA: Diagnosis not present

## 2018-04-14 DIAGNOSIS — S46011D Strain of muscle(s) and tendon(s) of the rotator cuff of right shoulder, subsequent encounter: Secondary | ICD-10-CM | POA: Diagnosis not present

## 2018-04-21 DIAGNOSIS — S46011D Strain of muscle(s) and tendon(s) of the rotator cuff of right shoulder, subsequent encounter: Secondary | ICD-10-CM | POA: Diagnosis not present

## 2018-04-21 DIAGNOSIS — M25511 Pain in right shoulder: Secondary | ICD-10-CM | POA: Diagnosis not present

## 2018-07-16 DIAGNOSIS — Z0000 Encounter for general adult medical examination without abnormal findings: Secondary | ICD-10-CM | POA: Diagnosis not present

## 2018-07-16 DIAGNOSIS — E663 Overweight: Secondary | ICD-10-CM | POA: Diagnosis not present

## 2018-08-25 DIAGNOSIS — R399 Unspecified symptoms and signs involving the genitourinary system: Secondary | ICD-10-CM | POA: Diagnosis not present

## 2018-11-05 DIAGNOSIS — Z1231 Encounter for screening mammogram for malignant neoplasm of breast: Secondary | ICD-10-CM | POA: Diagnosis not present

## 2018-11-05 DIAGNOSIS — Z6829 Body mass index (BMI) 29.0-29.9, adult: Secondary | ICD-10-CM | POA: Diagnosis not present

## 2018-11-05 DIAGNOSIS — Z124 Encounter for screening for malignant neoplasm of cervix: Secondary | ICD-10-CM | POA: Diagnosis not present

## 2018-11-05 DIAGNOSIS — Z01419 Encounter for gynecological examination (general) (routine) without abnormal findings: Secondary | ICD-10-CM | POA: Diagnosis not present

## 2018-11-05 DIAGNOSIS — R05 Cough: Secondary | ICD-10-CM | POA: Diagnosis not present

## 2019-07-20 ENCOUNTER — Other Ambulatory Visit: Payer: Self-pay | Admitting: Internal Medicine

## 2019-07-20 DIAGNOSIS — Z1231 Encounter for screening mammogram for malignant neoplasm of breast: Secondary | ICD-10-CM

## 2019-07-20 DIAGNOSIS — E2839 Other primary ovarian failure: Secondary | ICD-10-CM

## 2019-07-21 ENCOUNTER — Ambulatory Visit
Admission: RE | Admit: 2019-07-21 | Discharge: 2019-07-21 | Disposition: A | Discharge status: Home / Self Care | Payer: Medicare Other | Source: Ambulatory Visit | Attending: Internal Medicine | Admitting: Internal Medicine

## 2019-07-21 ENCOUNTER — Other Ambulatory Visit: Payer: Self-pay

## 2019-07-21 DIAGNOSIS — Z1231 Encounter for screening mammogram for malignant neoplasm of breast: Secondary | ICD-10-CM

## 2019-09-01 ENCOUNTER — Other Ambulatory Visit: Payer: Self-pay

## 2019-09-01 ENCOUNTER — Ambulatory Visit
Admission: RE | Admit: 2019-09-01 | Discharge: 2019-09-01 | Disposition: A | Discharge status: Home / Self Care | Payer: BC Managed Care – PPO | Source: Ambulatory Visit | Attending: Internal Medicine | Admitting: Internal Medicine

## 2019-09-01 DIAGNOSIS — E2839 Other primary ovarian failure: Secondary | ICD-10-CM

## 2020-03-12 ENCOUNTER — Encounter: Payer: Self-pay | Admitting: Gastroenterology

## 2020-06-19 DIAGNOSIS — L509 Urticaria, unspecified: Secondary | ICD-10-CM | POA: Diagnosis not present

## 2020-09-08 ENCOUNTER — Ambulatory Visit
Admission: EM | Admit: 2020-09-08 | Discharge: 2020-09-08 | Disposition: A | Discharge status: Home / Self Care | Payer: Medicare Other

## 2020-09-08 ENCOUNTER — Other Ambulatory Visit: Payer: Self-pay

## 2020-09-08 DIAGNOSIS — L2389 Allergic contact dermatitis due to other agents: Secondary | ICD-10-CM

## 2020-09-08 DIAGNOSIS — L500 Allergic urticaria: Secondary | ICD-10-CM

## 2020-09-08 MED ORDER — HYDROXYZINE HCL 25 MG PO TABS: 25.0000 mg | ORAL_TABLET | Freq: Four times a day (QID) | ORAL | 0 refills | Status: AC | PRN

## 2020-09-08 MED ORDER — PREDNISONE 20 MG PO TABS: 40.0000 mg | ORAL_TABLET | Freq: Every day | ORAL | 0 refills | Status: AC

## 2020-09-08 NOTE — ED Provider Notes (Signed)
[facility]    CSN: [id number] Arrival date & time: 09/08/20  1229      History   Chief Complaint Chief Complaint  Patient presents with   Rash    HPI Sydney Tran is a 65 y.o. female.   Presents with 2-week history of itchy rash that has been present to bilateral arms for approximately 2 weeks.  Denies any recent changes to environment, lotions, detergents, soaps, foods, etc.  Denies any known fevers.  Denies any upper respiratory symptoms.  Patient had triamcinolone cream leftover from previous rash and used it for this rash with no relief or improvement in rash.   Rash  History reviewed. No pertinent past medical history.  Patient Active Problem List   Diagnosis Date Noted   Dyspnea on exertion 04/14/2016   Upper airway cough syndrome 03/15/2016    History reviewed. No pertinent surgical history.  OB History   No obstetric history on file.      Home Medications    Prior to Admission medications   Medication Sig Start Date End Date Taking? Authorizing Provider  hydrOXYzine (ATARAX/VISTARIL) 25 MG tablet Take 1 tablet (25 mg total) by mouth every 6 (six) hours as needed for itching. 09/08/20  Yes Lance Muss, FNP  predniSONE (DELTASONE) 20 MG tablet Take 2 tablets (40 mg total) by mouth daily for 5 days. 09/08/20 09/13/20 Yes Lance Muss, FNP  meloxicam (MOBIC) 15 MG tablet Take 15 mg by mouth daily. 08/28/20   [provider]  triamcinolone cream (KENALOG) 0.1 % triamcinolone acetonide 0.1 % topical cream  APPLY CREAM EXTERNALLY TO AFFECTED AREA TWICE DAILY AS NEEDED(NOT TO FACE GROIN UNDERARMS)    [provider]    Family History History reviewed. No pertinent family history.  Social History Social History   Tobacco Use   Smoking status: Never   Smokeless tobacco: Never  Substance Use Topics   Drug use: Not Currently     Allergies   Patient has no known allergies.   Review of Systems Review of Systems  Skin:   Positive for rash.  Per HPI  Physical Exam Triage Vital Signs ED Triage Vitals [09/08/20 1243]  Enc Vitals Group     BP 133/68     Pulse Rate 66     Resp 18     Temp 97.9 F (36.6 C)     Temp Source Oral     SpO2 96 %     Weight      Height      Head Circumference      Peak Flow      Pain Score 0     Pain Loc      Pain Edu?      Excl. in GC?    No data found.  Updated Vital Signs BP 133/68 (BP Location: Left Arm)   Pulse 66   Temp 97.9 F (36.6 C) (Oral)   Resp 18   SpO2 96%   Visual Acuity Right Eye Distance:   Left Eye Distance:   Bilateral Distance:    Right Eye Near:   Left Eye Near:    Bilateral Near:     Physical Exam Constitutional:      Appearance: Normal appearance.  HENT:     Head: Normocephalic and atraumatic.  Eyes:     Extraocular Movements: Extraocular movements intact.     Conjunctiva/sclera: Conjunctivae normal.  Pulmonary:     Effort: Pulmonary effort is normal.  Skin:  General: Skin is warm and dry.     Findings: Rash present. Rash is urticarial.     Comments: Diffuse maculopapular erythematous rash present to bilateral arms.  Neurological:     General: No focal deficit present.     Mental Status: She is alert and oriented to person, place, and time. Mental status is at baseline.  Psychiatric:        Mood and Affect: Mood normal.        Behavior: Behavior normal.        Thought Content: Thought content normal.        Judgment: Judgment normal.     UC Treatments / Results  Labs (all labs ordered are listed, but only abnormal results are displayed) Labs Reviewed - No data to display  EKG   Radiology No results found.  Procedures Procedures (including critical care time)  Medications Ordered in UC Medications - No data to display  Initial Impression / Assessment and Plan / UC Course  I have reviewed the triage vital signs and the nursing notes.  Pertinent labs & imaging results that were available during my care  of the patient were reviewed by me and considered in my medical decision making (see chart for details).     Rash is consistent with allergic contact dermatitis.  Will treat with short course of prednisone due to it being refractory to triamcinolone cream.  Also prescribed patient hydroxyzine to take as needed for itching and prior to bedtime.  Advised patient that hydroxyzine can cause drowsiness.Discussed strict return precautions. Patient verbalized understanding and is agreeable with plan.  Final Clinical Impressions(s) / UC Diagnoses   Final diagnoses:  Allergic contact dermatitis due to other agents  Allergic urticaria     Discharge Instructions      You have been prescribed prednisone steroid to take to decrease allergic reaction.  Please take with food.  You have also been prescribed hydroxyzine to take as needed for itching.  Please be advised that this can cause drowsiness and you should take it prior to going to bed.  Please avoid taking any other over-the-counter antihistamines including Zyrtec and Claritin while on hydroxyzine.     ED Prescriptions     Medication Sig Dispense Auth. Provider   predniSONE (DELTASONE) 20 MG tablet Take 2 tablets (40 mg total) by mouth daily for 5 days. 10 tablet Lance Muss, FNP   hydrOXYzine (ATARAX/VISTARIL) 25 MG tablet Take 1 tablet (25 mg total) by mouth every 6 (six) hours as needed for itching. 12 tablet Lance Muss, FNP      PDMP not reviewed this encounter.   Lance Muss, FNP 09/08/20 1318

## 2020-09-08 NOTE — Discharge Instructions (Addendum)
You have been prescribed prednisone steroid to take to decrease allergic reaction.  Please take with food.  You have also been prescribed hydroxyzine to take as needed for itching.  Please be advised that this can cause drowsiness and you should take it prior to going to bed.  Please avoid taking any other over-the-counter antihistamines including Zyrtec and Claritin while on hydroxyzine.

## 2020-09-08 NOTE — ED Triage Notes (Signed)
Pt c/o rash to both arms x2wks. Denies any changes in products.

## 2020-10-10 DIAGNOSIS — R5381 Other malaise: Secondary | ICD-10-CM | POA: Diagnosis not present

## 2020-10-10 DIAGNOSIS — M542 Cervicalgia: Secondary | ICD-10-CM | POA: Diagnosis not present

## 2020-10-10 DIAGNOSIS — R42 Dizziness and giddiness: Secondary | ICD-10-CM | POA: Diagnosis not present

## 2020-10-10 DIAGNOSIS — R5383 Other fatigue: Secondary | ICD-10-CM | POA: Diagnosis not present

## 2020-10-10 DIAGNOSIS — R509 Fever, unspecified: Secondary | ICD-10-CM | POA: Diagnosis not present

## 2020-11-15 DIAGNOSIS — W57XXXA Bitten or stung by nonvenomous insect and other nonvenomous arthropods, initial encounter: Secondary | ICD-10-CM | POA: Diagnosis not present

## 2020-11-15 DIAGNOSIS — S40862A Insect bite (nonvenomous) of left upper arm, initial encounter: Secondary | ICD-10-CM | POA: Diagnosis not present

## 2020-12-06 DIAGNOSIS — L509 Urticaria, unspecified: Secondary | ICD-10-CM | POA: Diagnosis not present

## 2021-01-01 DIAGNOSIS — R42 Dizziness and giddiness: Secondary | ICD-10-CM | POA: Diagnosis not present

## 2021-01-01 DIAGNOSIS — H6121 Impacted cerumen, right ear: Secondary | ICD-10-CM | POA: Diagnosis not present

## 2021-01-08 DIAGNOSIS — H6121 Impacted cerumen, right ear: Secondary | ICD-10-CM | POA: Diagnosis not present

## 2021-01-23 DIAGNOSIS — R490 Dysphonia: Secondary | ICD-10-CM | POA: Diagnosis not present

## 2021-05-03 DIAGNOSIS — M79602 Pain in left arm: Secondary | ICD-10-CM | POA: Diagnosis not present

## 2021-09-12 DIAGNOSIS — I10 Essential (primary) hypertension: Secondary | ICD-10-CM | POA: Diagnosis not present

## 2021-09-12 DIAGNOSIS — R0600 Dyspnea, unspecified: Secondary | ICD-10-CM | POA: Diagnosis not present

## 2021-09-25 ENCOUNTER — Other Ambulatory Visit: Payer: Self-pay | Admitting: Internal Medicine

## 2021-09-25 DIAGNOSIS — M8580 Other specified disorders of bone density and structure, unspecified site: Secondary | ICD-10-CM

## 2021-10-19 DIAGNOSIS — R490 Dysphonia: Secondary | ICD-10-CM | POA: Diagnosis not present

## 2021-10-19 DIAGNOSIS — K219 Gastro-esophageal reflux disease without esophagitis: Secondary | ICD-10-CM | POA: Diagnosis not present

## 2021-10-31 ENCOUNTER — Encounter: Payer: Self-pay | Admitting: Gastroenterology

## 2021-11-20 ENCOUNTER — Ambulatory Visit (AMBULATORY_SURGERY_CENTER): Payer: Self-pay | Admitting: *Deleted

## 2021-11-20 VITALS — Ht 66.0 in | Wt 186.2 lb

## 2021-11-20 DIAGNOSIS — Z1211 Encounter for screening for malignant neoplasm of colon: Secondary | ICD-10-CM

## 2021-11-20 MED ORDER — NA SULFATE-K SULFATE-MG SULF 17.5-3.13-1.6 GM/177ML PO SOLN: 1.0000 | Freq: Once | ORAL | 0 refills | Status: AC

## 2021-11-20 NOTE — Progress Notes (Signed)

## 2021-11-27 ENCOUNTER — Encounter: Payer: Self-pay | Admitting: Gastroenterology

## 2021-12-04 ENCOUNTER — Ambulatory Visit (AMBULATORY_SURGERY_CENTER): Payer: Medicare Other | Admitting: Gastroenterology

## 2021-12-04 ENCOUNTER — Encounter: Payer: Self-pay | Admitting: Gastroenterology

## 2021-12-04 VITALS — BP 133/78 | HR 60 | Temp 97.1°F | Resp 12 | Ht 66.0 in | Wt 186.0 lb

## 2021-12-04 DIAGNOSIS — Z1211 Encounter for screening for malignant neoplasm of colon: Secondary | ICD-10-CM | POA: Diagnosis not present

## 2021-12-04 MED ORDER — SODIUM CHLORIDE 0.9 % IV SOLN: 500.0000 mL | Freq: Once | INTRAVENOUS | Status: DC

## 2021-12-04 NOTE — Progress Notes (Signed)
Pt's states no medical or surgical changes since previsit or office visit. 

## 2021-12-04 NOTE — Op Note (Signed)
[place] Patient Name: Sydney Tran Procedure Date: 12/04/2021 11:14 AM MRN: [id number] Endoscopist: Mauri Pole , MD, [id number] Age: 66 Referring MD:  Date of Birth: [birth year] Gender: Female Account #: [id number] Procedure:                Colonoscopy Indications:              Screening for colorectal malignant neoplasm Medicines:                Monitored Anesthesia Care Procedure:                Pre-Anesthesia Assessment:                           - Prior to the procedure, a History and Physical                            was performed, and patient medications and                            allergies were reviewed. The patient's tolerance of                            previous anesthesia was also reviewed. The risks                            and benefits of the procedure and the sedation                            options and risks were discussed with the patient.                            All questions were answered, and informed consent                            was obtained. Prior Anticoagulants: The patient has                            taken no anticoagulant or antiplatelet agents. ASA                            Grade Assessment: III - A patient with severe                            systemic disease. After reviewing the risks and                            benefits, the patient was deemed in satisfactory                            condition to undergo the procedure.                           After obtaining informed consent, the colonoscope  was passed under direct vision. Throughout the                            procedure, the patient's blood pressure, pulse, and                            oxygen saturations were monitored continuously. The                            Olympus PCF-H190DL [id number]) Colonoscope was                            introduced through the anus and advanced to the the                            cecum,  identified by appendiceal orifice and                            ileocecal valve. The colonoscopy was performed                            without difficulty. The patient tolerated the                            procedure well. The quality of the bowel                            preparation was good. The ileocecal valve,                            appendiceal orifice, and rectum were photographed. Scope In: 11:21:58 AM Scope Out: 11:34:38 AM Scope Withdrawal Time: 0 hours 7 minutes 13 seconds  Total Procedure Duration: 0 hours 12 minutes 40 seconds  Findings:                 The perianal and digital rectal examinations were                            normal.                           Scattered small-mouthed diverticula were found in                            the sigmoid colon.                           Non-bleeding external and internal hemorrhoids were                            found during retroflexion. The hemorrhoids were                            medium-sized. Complications:            No immediate complications. Estimated Blood Loss:     Estimated  blood loss was minimal. Impression:               - Diverticulosis in the sigmoid colon.                           - Non-bleeding external and internal hemorrhoids.                           - No specimens collected. Recommendation:           - Patient has a contact number available for                            emergencies. The signs and symptoms of potential                            delayed complications were discussed with the                            patient. Return to normal activities tomorrow.                            Written discharge instructions were provided to the                            patient.                           - Resume previous diet.                           - Continue present medications.                           - No repeat colonoscopy due to age. Napoleon Form, MD 12/04/2021 11:39:44  AM This report has been signed electronically.

## 2021-12-04 NOTE — Progress Notes (Signed)
Vss nad trans to pacu °

## 2021-12-04 NOTE — Progress Notes (Signed)
[facility] History and Physical   Primary Care Physician:  Renford Dills, MD   Reason for Procedure:  Colorectal cancer screening  Plan:    Screening colonoscopy with possible interventions as needed     HPI: Sydney Tran is a very pleasant 66 y.o. female here for screening colonoscopy. Denies any nausea, vomiting, abdominal pain, melena or bright red blood per rectum  The risks and benefits as well as alternatives of endoscopic procedure(s) have been discussed and reviewed. All questions answered. The patient agrees to proceed.    Past Medical History:  Diagnosis Date   Allergy    SEASONAL   Asthma    GERD (gastroesophageal reflux disease)    Hypertension    Sleep apnea     Past Surgical History:  Procedure Laterality Date   TONSILLECTOMY     TUBAL LIGATION      Prior to Admission medications   Medication Sig Start Date End Date Taking? Authorizing Provider  aspirin EC 81 MG tablet Take 81 mg by mouth daily. Swallow whole.   Yes [provider]  CALCIUM PO Take by mouth. TAKE ONE DAILY   Yes [provider]  cetirizine (ZYRTEC) 10 MG tablet Take by mouth. 10/22/21  Yes [provider]  Cholecalciferol (VITAMIN D3) 125 MCG (5000 UT) CAPS Vitamin D3   Yes [provider]  CINNAMON PO Take 1,000 mg by mouth daily.   Yes [provider]  famotidine (PEPCID) 20 MG tablet Take 20 mg by mouth at bedtime. 10/22/21  Yes [provider]  hydrochlorothiazide (HYDRODIURIL) 25 MG tablet Take 25 mg by mouth daily.   Yes [provider]  meloxicam (MOBIC) 15 MG tablet Take 15 mg by mouth daily. 08/28/20  Yes [provider]  Multiple Vitamin (MULTIVITAMIN ADULT PO) daily.   Yes [provider]  Turmeric (QC TUMERIC COMPLEX PO) Take by mouth daily. TAKE ONE DAILY   Yes [provider]  albuterol (VENTOLIN HFA) 108 (90 Base) MCG/ACT inhaler INHALE 2 PUFFS BY MOUTH EVERY 6 HOURS AS  NEEDED FOR 30 DAYS    [provider]  Ginger, Zingiber officinalis, (GINGER PO) Take by mouth daily. TAKE ONE DAILY    [provider]  hydrOXYzine (ATARAX/VISTARIL) 25 MG tablet Take 1 tablet (25 mg total) by mouth every 6 (six) hours as needed for itching. 09/08/20   Gustavus Bryant, FNP  OVER THE COUNTER MEDICATION daily. POTASSIUM TAKE ONE DAILY    [provider]  triamcinolone cream (KENALOG) 0.1 % triamcinolone acetonide 0.1 % topical cream  APPLY CREAM EXTERNALLY TO AFFECTED AREA TWICE DAILY AS NEEDED(NOT TO FACE GROIN UNDERARMS)    [provider]    Current Outpatient Medications  Medication Sig Dispense Refill   aspirin EC 81 MG tablet Take 81 mg by mouth daily. Swallow whole.     CALCIUM PO Take by mouth. TAKE ONE DAILY     cetirizine (ZYRTEC) 10 MG tablet Take by mouth.     Cholecalciferol (VITAMIN D3) 125 MCG (5000 UT) CAPS Vitamin D3     CINNAMON PO Take 1,000 mg by mouth daily.     famotidine (PEPCID) 20 MG tablet Take 20 mg by mouth at bedtime.     hydrochlorothiazide (HYDRODIURIL) 25 MG tablet Take 25 mg by mouth daily.     meloxicam (MOBIC) 15 MG tablet Take 15 mg by mouth daily.     Multiple Vitamin (MULTIVITAMIN ADULT PO) daily.     Turmeric (QC TUMERIC  COMPLEX PO) Take by mouth daily. TAKE ONE DAILY     albuterol (VENTOLIN HFA) 108 (90 Base) MCG/ACT inhaler INHALE 2 PUFFS BY MOUTH EVERY 6 HOURS AS NEEDED FOR 30 DAYS     Ginger, Zingiber officinalis, (GINGER PO) Take by mouth daily. TAKE ONE DAILY     hydrOXYzine (ATARAX/VISTARIL) 25 MG tablet Take 1 tablet (25 mg total) by mouth every 6 (six) hours as needed for itching. 12 tablet 0   OVER THE COUNTER MEDICATION daily. POTASSIUM TAKE ONE DAILY     triamcinolone cream (KENALOG) 0.1 % triamcinolone acetonide 0.1 % topical cream  APPLY CREAM EXTERNALLY TO AFFECTED AREA TWICE DAILY AS NEEDED(NOT TO FACE GROIN UNDERARMS)     Current Facility-Administered Medications  Medication Dose  Route Frequency Provider Last Rate Last Admin   0.9 %  sodium chloride infusion  500 mL Intravenous Once Mauri Pole, MD        Allergies as of 12/04/2021   (No Known Allergies)    Family History  Problem Relation Age of Onset   Colon cancer Neg Hx    Colon polyps Neg Hx    Crohn's disease Neg Hx    Esophageal cancer Neg Hx    Stomach cancer Neg Hx    Rectal cancer Neg Hx    Ulcerative colitis Neg Hx     Social History   Socioeconomic History   Marital status: Single    Spouse name: Not on file   Number of children: Not on file   Years of education: Not on file   Highest education level: Not on file  Occupational History   Not on file  Tobacco Use   Smoking status: Never    Passive exposure: Past   Smokeless tobacco: Never  Vaping Use   Vaping Use: Never used  Substance and Sexual Activity   Alcohol use: Not Currently   Drug use: Never   Sexual activity: Not on file  Other Topics Concern   Not on file  Social History Narrative   Not on file   Social Determinants of Health   Financial Resource Strain: Not on file  Food Insecurity: Not on file  Transportation Needs: Not on file  Physical Activity: Not on file  Stress: Not on file  Social Connections: Not on file  Intimate Partner Violence: Not on file    Review of Systems:  All other review of systems negative except as mentioned in the HPI.  Physical Exam: Vital signs in last 24 hours: Blood Pressure 116/63   Pulse 67   Temperature (Abnormal) 97.1 F (36.2 C)   Height 5\' 6"  (1.676 m)   Weight 186 lb (84.4 kg)   Oxygen Saturation 100%   Body Mass Index 30.02 kg/m  General:   Alert, NAD Lungs:  Clear .   Heart:  Regular rate and rhythm Abdomen:  Soft, nontender and nondistended. Neuro/Psych:  Alert and cooperative. Normal mood and affect. A and O x 3  Reviewed labs, radiology imaging, old records and pertinent past GI work up  Patient is appropriate for planned procedure(s) and  anesthesia in an ambulatory setting   K. Denzil Magnuson , MD [phone]

## 2021-12-04 NOTE — Patient Instructions (Signed)
HANDOUTS PROVIDED ON: DIVERTICULOSIS & HEMORRHOIDS  You may resume your previous diet and medication schedule.  Thank you for allowing us to care for you today!!!   YOU HAD AN ENDOSCOPIC PROCEDURE TODAY AT THE [facility]:   Refer to the procedure report that was given to you for any specific questions about what was found during the examination.  If the procedure report does not answer your questions, please call your gastroenterologist to clarify.  If you requested that your care partner not be given the details of your procedure findings, then the procedure report has been included in a sealed envelope for you to review at your convenience later.  YOU SHOULD EXPECT: Some feelings of bloating in the abdomen. Passage of more gas than usual.  Walking can help get rid of the air that was put into your GI tract during the procedure and reduce the bloating. If you had a lower endoscopy (such as a colonoscopy or flexible sigmoidoscopy) you may notice spotting of blood in your stool or on the toilet paper. If you underwent a bowel prep for your procedure, you may not have a normal bowel movement for a few days.  Please Note:  You might notice some irritation and congestion in your nose or some drainage.  This is from the oxygen used during your procedure.  There is no need for concern and it should clear up in a day or so.  SYMPTOMS TO REPORT IMMEDIATELY:  Following lower endoscopy (colonoscopy or flexible sigmoidoscopy):  Excessive amounts of blood in the stool  Significant tenderness or worsening of abdominal pains  Swelling of the abdomen that is new, acute  Fever of 100F or higher  For urgent or emergent issues, a gastroenterologist can be reached at any hour by calling [phone]. Do not use MyChart messaging for urgent concerns.    DIET:  We do recommend a small meal at first, but then you may proceed to your regular diet.  Drink plenty of fluids but you should avoid  alcoholic beverages for 24 hours.  ACTIVITY:  You should plan to take it easy for the rest of today and you should NOT DRIVE or use heavy machinery until tomorrow (because of the sedation medicines used during the test).    FOLLOW UP: Our staff will call the number listed on your records the next business day following your procedure.  We will call around 7:15- 8:00 am to check on you and address any questions or concerns that you may have regarding the information given to you following your procedure. If we do not reach you, we will leave a message.     If any biopsies were taken you will be contacted by phone or by letter within the next 1-3 weeks.  Please call us at [phone] if you have not heard about the biopsies in 3 weeks.    SIGNATURES/CONFIDENTIALITY: You and/or your care partner have signed paperwork which will be entered into your electronic medical record.  These signatures attest to the fact that that the information above on your After Visit Summary has been reviewed and is understood.  Full responsibility of the confidentiality of this discharge information lies with you and/or your care-partner.  

## 2021-12-05 ENCOUNTER — Telehealth: Payer: Self-pay | Admitting: *Deleted

## 2021-12-05 NOTE — Telephone Encounter (Signed)
  Follow up Call-     12/04/2021   10:54 AM  Call back number  Post procedure Call Back phone  # [phone]  Permission to leave phone message Yes     Patient questions:  Do you have a fever, pain , or abdominal swelling? No. Pain Score  0 *  Have you tolerated food without any problems? Yes.    Have you been able to return to your normal activities? Yes.    Do you have any questions about your discharge instructions: Diet   No. Medications  No. Follow up visit  No.  Do you have questions or concerns about your Care? No.  Actions: * If pain score is 4 or above: No action needed, pain <4.

## 2022-02-08 ENCOUNTER — Ambulatory Visit
Admission: RE | Admit: 2022-02-08 | Discharge: 2022-02-08 | Disposition: A | Discharge status: Home / Self Care | Payer: Medicare Other | Source: Ambulatory Visit | Attending: Physician Assistant | Admitting: Physician Assistant

## 2022-02-08 ENCOUNTER — Other Ambulatory Visit: Payer: Self-pay | Admitting: Physician Assistant

## 2022-02-08 DIAGNOSIS — M25561 Pain in right knee: Secondary | ICD-10-CM

## 2022-02-08 DIAGNOSIS — M25461 Effusion, right knee: Secondary | ICD-10-CM | POA: Diagnosis not present

## 2022-02-21 DIAGNOSIS — M1711 Unilateral primary osteoarthritis, right knee: Secondary | ICD-10-CM | POA: Diagnosis not present

## 2022-02-28 ENCOUNTER — Ambulatory Visit: Payer: Medicare Other

## 2022-02-28 NOTE — Therapy (Incomplete)
OUTPATIENT PHYSICAL THERAPY LOWER EXTREMITY EVALUATION   Patient Name: Sydney Tran MRN: [id number] DOB:11-24-1955, 67 y.o., female Today's Date: 02/28/2022  END OF SESSION:   Past Medical History:  Diagnosis Date   Allergy    SEASONAL   Asthma    GERD (gastroesophageal reflux disease)    Hypertension    Sleep apnea    Past Surgical History:  Procedure Laterality Date   TONSILLECTOMY     TUBAL LIGATION     Patient Active Problem List   Diagnosis Date Noted   Dyspnea on exertion 04/14/2016   Upper airway cough syndrome 03/15/2016    PCP: Seward Carol, MD  REFERRING PROVIDER: Pedro Earls, MD  REFERRING DIAG: right knee OA   THERAPY DIAG:  No diagnosis found.  Rationale for Evaluation and Treatment: Rehabilitation  ONSET DATE: ***  SUBJECTIVE:   SUBJECTIVE STATEMENT: ***  PERTINENT HISTORY: *** PAIN:  Are you having pain?  Yes: NPRS scale: ***/10 Pain location: *** Pain description: *** Aggravating factors: *** Relieving factors: ***  PRECAUTIONS: {Therapy precautions:24002}  WEIGHT BEARING RESTRICTIONS: {Yes ***/No:24003}  FALLS:  Has patient fallen in last 6 months? {fallsyesno:27318}  LIVING ENVIRONMENT: Lives with: {[facility] lives with:25569::"lives with their family"} Lives in: {Lives in:25570} Stairs: {opstairs:27293} Has following equipment at home: {Assistive devices:23999}  OCCUPATION: ***  PLOF: {PLOF:24004}  PATIENT GOALS: ***  NEXT MD VISIT: ***  OBJECTIVE:   DIAGNOSTIC FINDINGS: ***  PATIENT SURVEYS:  FOTO: ***% function; ***% predicted   COGNITION: Overall cognitive status: {cognition:24006}     SENSATION: {sensation:27233}  EDEMA:  {edema:24020}  MUSCLE LENGTH: Hamstrings: Right *** deg; Left *** deg Thomas test: Right *** deg; Left *** deg  POSTURE: {posture:25561}  PALPATION: ***  LOWER EXTREMITY ROM:  {AROM/PROM:27142} ROM Right eval Left eval  Hip flexion    Hip extension    Hip abduction     Hip adduction    Hip internal rotation    Hip external rotation    Knee flexion    Knee extension    Ankle dorsiflexion    Ankle plantarflexion    Ankle inversion    Ankle eversion     (Blank rows = not tested)  LOWER EXTREMITY MMT:  MMT Right eval Left eval  Hip flexion    Hip extension    Hip abduction    Hip adduction    Hip internal rotation    Hip external rotation    Knee flexion    Knee extension    Ankle dorsiflexion    Ankle plantarflexion    Ankle inversion    Ankle eversion     (Blank rows = not tested)  LOWER EXTREMITY SPECIAL TESTS:  {LEspecialtests:26242}  FUNCTIONAL TESTS:  {Functional tests:24029}  GAIT: Distance walked: *** Assistive device utilized: {Assistive devices:23999} Level of assistance: {Levels of assistance:24026} Comments: ***   TREATMENT: [facility] Adult PT Treatment:                                                DATE: *** Therapeutic Exercise: *** Manual Therapy: *** Neuromuscular re-ed: *** Therapeutic Activity: *** Modalities: *** Self Care: ***   PATIENT EDUCATION:  Education details: *** Person educated: {Person educated:25204} Education method: {Education Method:25205} Education comprehension: {Education Comprehension:25206}  HOME EXERCISE PROGRAM: ***  ASSESSMENT:  CLINICAL IMPRESSION: Patient is a *** y.o. *** who was seen today for physical  therapy evaluation and treatment for ***.   OBJECTIVE IMPAIRMENTS: {opptimpairments:25111}.   ACTIVITY LIMITATIONS: {activitylimitations:27494}  PARTICIPATION LIMITATIONS: {participationrestrictions:25113}  PERSONAL FACTORS: {Personal factors:25162} are also affecting patient's functional outcome.   REHAB POTENTIAL: {rehabpotential:25112}  CLINICAL DECISION MAKING: {clinical decision making:25114}  EVALUATION COMPLEXITY: {Evaluation complexity:25115}   GOALS: Goals reviewed with patient? {yes/no:20286}  SHORT TERM GOALS: Target date: 03/21/2022   Pt  will be compliant and knowledgeable with initial HEP for improved comfort and carryover Baseline: initial HEP given  Goal status: INITIAL  2.  Pt will self report *** pain no greater than ***/10 for improved comfort and functional ability Baseline: ***/10 at worst Goal status: {GOALSTATUS:25110}    LONG TERM GOALS: Target date: 04/25/2022   Pt will improve FOTO function score to no less than ***% as proxy for functional improvement Baseline: ***% function Goal status: {GOALSTATUS:25110}   2.  Pt will self report *** pain no greater than ***/10 for improved comfort and functional ability Baseline: ***/10 at worst Goal status: {GOALSTATUS:25110}   3.  *** Baseline:  Goal status: {GOALSTATUS:25110}  4.  *** Baseline:  Goal status: {GOALSTATUS:25110}  5.  *** Baseline:  Goal status: {GOALSTATUS:25110}   PLAN:  PT FREQUENCY: {rehab frequency:25116}  PT DURATION: {rehab duration:25117}  PLANNED INTERVENTIONS: {rehab planned interventions:25118::"Therapeutic exercises","Therapeutic activity","Neuromuscular re-education","Balance training","Gait training","Patient/Family education","Self Care","Joint mobilization"}  PLAN FOR NEXT SESSION: ***   Ward Chatters, PT 02/28/2022, 8:51 AM

## 2022-03-11 ENCOUNTER — Other Ambulatory Visit: Payer: Self-pay

## 2022-03-11 ENCOUNTER — Ambulatory Visit: Payer: Medicare Other

## 2022-03-11 ENCOUNTER — Ambulatory Visit: Payer: Medicare Other | Attending: Internal Medicine

## 2022-03-11 DIAGNOSIS — R262 Difficulty in walking, not elsewhere classified: Secondary | ICD-10-CM

## 2022-03-11 DIAGNOSIS — M6281 Muscle weakness (generalized): Secondary | ICD-10-CM

## 2022-03-11 DIAGNOSIS — G8929 Other chronic pain: Secondary | ICD-10-CM | POA: Insufficient documentation

## 2022-03-11 DIAGNOSIS — M25561 Pain in right knee: Secondary | ICD-10-CM | POA: Diagnosis not present
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
## 2022-03-21 ENCOUNTER — Ambulatory Visit
Admission: RE | Admit: 2022-03-21 | Discharge: 2022-03-21 | Disposition: A | Discharge status: Home / Self Care | Payer: Medicare Other | Source: Ambulatory Visit | Attending: Internal Medicine | Admitting: Internal Medicine

## 2022-03-21 DIAGNOSIS — M8580 Other specified disorders of bone density and structure, unspecified site: Secondary | ICD-10-CM

## 2022-03-21 DIAGNOSIS — M8588 Other specified disorders of bone density and structure, other site: Secondary | ICD-10-CM | POA: Diagnosis not present

## 2022-03-21 DIAGNOSIS — Z780 Asymptomatic menopausal state: Secondary | ICD-10-CM | POA: Diagnosis not present

## 2022-03-22 ENCOUNTER — Encounter: Payer: Self-pay | Admitting: Physical Therapy

## 2022-03-22 ENCOUNTER — Ambulatory Visit: Payer: Medicare Other | Admitting: Physical Therapy

## 2022-03-22 DIAGNOSIS — M6281 Muscle weakness (generalized): Secondary | ICD-10-CM | POA: Diagnosis not present

## 2022-03-22 DIAGNOSIS — R262 Difficulty in walking, not elsewhere classified: Secondary | ICD-10-CM | POA: Diagnosis not present

## 2022-03-22 DIAGNOSIS — G8929 Other chronic pain: Secondary | ICD-10-CM

## 2022-03-22 DIAGNOSIS — M25561 Pain in right knee: Secondary | ICD-10-CM | POA: Diagnosis not present

## 2022-03-22 NOTE — Therapy (Signed)
OUTPATIENT PHYSICAL THERAPY TREATMENT NOTE   Patient Name: Sydney Tran MRN: [id number] DOB:[birth year], 67 y.o., female Today's Date: 03/22/2022  PCP: Seward Carol, MD   REFERRING PROVIDER: Pedro Earls, MD  END OF SESSION:   PT End of Session - 03/22/22 0808     Visit Number 2    Number of Visits 13    Date for PT Re-Evaluation 04/27/22    Authorization Type MCR    PT Start Time 0810   pt arrived late   PT Stop Time 0842    PT Time Calculation (min) 32 min             Past Medical History:  Diagnosis Date   Allergy    SEASONAL   Asthma    GERD (gastroesophageal reflux disease)    Hypertension    Sleep apnea    Past Surgical History:  Procedure Laterality Date   TONSILLECTOMY     TUBAL LIGATION     Patient Active Problem List   Diagnosis Date Noted   Dyspnea on exertion 04/14/2016   Upper airway cough syndrome 03/15/2016    REFERRING DIAG: Rt knee OA   THERAPY DIAG:  Chronic pain of right knee  Muscle weakness (generalized)  Difficulty in walking, not elsewhere classified  Rationale for Evaluation and Treatment Rehabilitation  PERTINENT HISTORY: Asthma  Hypertension  PRECAUTIONS: None   WEIGHT BEARING RESTRICTIONS: No  SUBJECTIVE:                                                                                                                                                                                      SUBJECTIVE STATEMENT:  I'm still having pain at the inside of the knee. The exercises hurt.    PAIN:  Are you having pain? None currently Yes: NPRS scale: at worst 6/10 Pain location: Rt anteromedial knee Pain description: sharp, stiff Aggravating factors: transitioning from sitting to standing, sleep, prolonged walking Relieving factors: topical cream   OBJECTIVE: (objective measures completed at initial evaluation unless otherwise dated)    DIAGNOSTIC FINDINGS: Rt knee X-ray: FINDINGS: No evidence of fracture,  dislocation, or joint effusion. There is tricompartmental joint space narrowing, sclerosis and osteophytes consistent with degenerative joint disease. No evidence of effusion.   IMPRESSION: Degenerative changes.   PATIENT SURVEYS:  FOTO 61% function to 67% predicted   COGNITION: Overall cognitive status: Within functional limits for tasks assessed                         SENSATION: Not tested   EDEMA:  No obvious swelling about the knee    MUSCLE LENGTH: Hamstrings: mild  tightness bilaterally    POSTURE: No Significant postural limitations   PALPATION: No palpable tenderness about Rt knee    LOWER EXTREMITY ROM:   Active ROM Right eval Left eval  Hip flexion      Hip extension      Hip abduction      Hip adduction      Hip internal rotation      Hip external rotation      Knee flexion 114 123  Knee extension      Ankle dorsiflexion      Ankle plantarflexion      Ankle inversion      Ankle eversion       (Blank rows = not tested)   LOWER EXTREMITY MMT:   MMT Right eval Left eval  Hip flexion 4- 4  Hip extension 4 4  Hip abduction 4 4+  Hip adduction      Hip internal rotation      Hip external rotation      Knee flexion 4- 5  Knee extension 4 5  Ankle dorsiflexion      Ankle plantarflexion      Ankle inversion      Ankle eversion       (Blank rows = not tested)   LOWER EXTREMITY SPECIAL TESTS:  (+) Ely (-) valgus, varus, anterior drawer, posterior drawer, McMurray's    FUNCTIONIAL TESTS:  5 x STS: 16.6 seconds knee pain  6 MWT: 1124 ft pain after 4.5 minutes   GAIT: Distance walked: 6MWT Assistive device utilized: None Level of assistance: Complete Independence Comments: Trendelenburg Rt    [facility] Adult PT Treatment:                                                DATE: 03/22/22 Therapeutic Exercise: Nustep L3 UE/LE x 5 minutes  Seated LAQ 10 x 2 - c/o knee pain at end of last set.  Hip flexor stretch -opp leg flexed and pulled to chest  options-  QS into towel 5 sec x 10 Right AROM heel slide on slide board x 10- increased anterior knee pain Hamstring stretch with strap x 3  Blue band clam shell x 20  Banded bridge x 10   Manual Therapy: Effleurage to right quad during modified thomas Patella mobs   [facility] Adult PT Treatment:                                                DATE: 03/11/22 Therapeutic Exercise: Demonstrated and issue initial HEP.      Therapeutic Activity: Education on assessment findings that will be addressed throughout duration of POC.          PATIENT EDUCATION:  Education details: see treatment  Person educated: Patient Education method: Explanation, Demonstration, Tactile cues, Verbal cues, and Handouts Education comprehension: verbalized understanding, returned demonstration, verbal cues required, tactile cues required, and needs further education   HOME EXERCISE PROGRAM: Access Code: [email or web address] URL: [email or web address] Date: 03/11/2022 Prepared by: Gwendolyn Grant   Exercises - Modified Thomas Stretch  - 1 x daily - 7 x weekly - 3 sets - 30 sec hold - Seated Long Arc Quad  - 1 x daily - 7 x weekly -  2 sets - 10 reps - Hooklying Clamshell with Resistance  - 1 x daily - 7 x weekly - 2 sets - 10 reps Added 03/22/22 Banded bridge 10 reps, 2 sets    ASSESSMENT:   CLINICAL IMPRESSION: Patient is a 67 y.o. female who was seen today for physical therapy treatment for chronic Rt knee pain. Her signs and symptoms are consistent with knee OA. She reports compliance with HEP and pain during LAQ. She did have pain during last set of LAQ and was encouraged to stop the exercise when painful. Continued with other quad activation, hamstring strength and patella mobs. She tolerated session without adverse effects. She will benefit from skilled PT to address the above stated deficits in order to optimize her function.    OBJECTIVE IMPAIRMENTS: Abnormal gait, decreased activity tolerance,  decreased knowledge of condition, difficulty walking, decreased ROM, decreased strength, improper body mechanics, and pain.    ACTIVITY LIMITATIONS: carrying, lifting, bending, standing, sleeping, and locomotion level   PARTICIPATION LIMITATIONS: meal prep, cleaning, laundry, shopping, and community activity   PERSONAL FACTORS: Age, Fitness, Time since onset of injury/illness/exacerbation, and 1-2 comorbidities: see PMH above  are also affecting patient's functional outcome.    REHAB POTENTIAL: Good   CLINICAL DECISION MAKING: Stable/uncomplicated   EVALUATION COMPLEXITY: Low     GOALS: Goals reviewed with patient? Yes   SHORT TERM GOALS: Target date: 04/01/2022     Patient will be independent and compliant with initial HEP.    Baseline: issued at eval Goal status: INITIAL   2.  Patient will demonstrate at least 120 degrees of Rt knee flexion AROM to improve ease of sit to stand transfers.  Baseline: see above Goal status: INITIAL   3.  Patient will complete 5 x STS in </= 12 seconds to improve functional strength.  Baseline: see above  Goal status: INITIAL     LONG TERM GOALS: Target date: 04/22/2022       Patient will score at least 67% on FOTO to signify clinically meaningful improvement in functional abilities.    Baseline: see above  Goal status: INITIAL   2.  Patient will walk at least 1500 ft without onset of knee pain during 6 MWT.  Baseline: see above  Goal status: INITIAL   3.  Patient will demonstrate 5/5 Rt knee strength to improve gait stability.  Baseline: see above  Goal status: INITIAL   4.  Patient will report pain at worst rated as </= 3/10 to reduce her current functional limitations.  Baseline: see above  Goal status: INITIAL   5.  Patient will be independent with advanced home program to assist in management of her chronic condition.  Baseline: initial HEP issued  Goal status: INITIAL     PLAN:   PT FREQUENCY: 1-2x/week   PT DURATION:  6 weeks   PLANNED INTERVENTIONS: Therapeutic exercises, Therapeutic activity, Neuromuscular re-education, Balance training, Gait training, Patient/Family education, Self Care, Joint mobilization, Aquatic Therapy, Dry Needling, Cryotherapy, Moist heat, Manual therapy, and Re-evaluation   PLAN FOR NEXT SESSION: review and progress HEP prn; quad/hamstring strengthening, glute strengthening, progress CKC strengthening as tolerated.      Dorene Ar, PTA 03/22/2022, 2:13 PM

## 2022-03-22 NOTE — Therapy (Deleted)
OUTPATIENT PHYSICAL THERAPY TREATMENT NOTE   Patient Name: Sydney Tran MRN: [id number] DOB:11/07/1955, 67 y.o., female Today's Date: 03/22/2022  PCP: Seward Carol, MD   REFERRING PROVIDER: Pedro Earls, MD  END OF SESSION:   PT End of Session - 03/22/22 0808     Visit Number 2    Number of Visits 13    Date for PT Re-Evaluation 04/27/22    Authorization Type MCR    PT Start Time 0810             Past Medical History:  Diagnosis Date   Allergy    SEASONAL   Asthma    GERD (gastroesophageal reflux disease)    Hypertension    Sleep apnea    Past Surgical History:  Procedure Laterality Date   TONSILLECTOMY     TUBAL LIGATION     Patient Active Problem List   Diagnosis Date Noted   Dyspnea on exertion 04/14/2016   Upper airway cough syndrome 03/15/2016    REFERRING DIAG: ***  THERAPY DIAG:  Chronic pain of right knee  Muscle weakness (generalized)  Difficulty in walking, not elsewhere classified  Rationale for Evaluation and Treatment {HABREHAB:[id number]}  PERTINENT HISTORY: ***  PRECAUTIONS: ***  SUBJECTIVE:                                                                                                                                                                                      SUBJECTIVE STATEMENT:  The exercises hurt my knee. I felt it when I got out of bed this morning. 3-4/10 this morning, and 0/10 now while sitting.    PAIN:  Are you having pain? None currently  Pain location: Rt anteromedial knee Pain description: sharp, stiff Aggravating factors: transitioning from sitting to standing, sleep, prolonged walking Relieving factors: topical cream   OBJECTIVE: (objective measures completed at initial evaluation unless otherwise dated)   DIAGNOSTIC FINDINGS: Rt knee X-ray: FINDINGS: No evidence of fracture, dislocation, or joint effusion. There is tricompartmental joint space narrowing, sclerosis and osteophytes consistent with  degenerative joint disease. No evidence of effusion.   IMPRESSION: Degenerative changes.   PATIENT SURVEYS:  FOTO 61% function to 67% predicted   COGNITION: Overall cognitive status: Within functional limits for tasks assessed                         SENSATION: Not tested   EDEMA:  No obvious swelling about the knee    MUSCLE LENGTH: Hamstrings: mild tightness bilaterally    POSTURE: No Significant postural limitations   PALPATION: No palpable tenderness about Rt knee    LOWER EXTREMITY ROM:  Active ROM Right eval Left eval  Hip flexion      Hip extension      Hip abduction      Hip adduction      Hip internal rotation      Hip external rotation      Knee flexion 114 123  Knee extension      Ankle dorsiflexion      Ankle plantarflexion      Ankle inversion      Ankle eversion       (Blank rows = not tested)   LOWER EXTREMITY MMT:   MMT Right eval Left eval  Hip flexion 4- 4  Hip extension 4 4  Hip abduction 4 4+  Hip adduction      Hip internal rotation      Hip external rotation      Knee flexion 4- 5  Knee extension 4 5  Ankle dorsiflexion      Ankle plantarflexion      Ankle inversion      Ankle eversion       (Blank rows = not tested)   LOWER EXTREMITY SPECIAL TESTS:  (+) Ely (-) valgus, varus, anterior drawer, posterior drawer, McMurray's    FUNCTIONIAL TESTS:  5 x STS: 16.6 seconds knee pain  6 MWT: 1124 ft pain after 4.5 minutes   GAIT: Distance walked: 6MWT Assistive device utilized: None Level of assistance: Complete Independence Comments: Trendelenburg Rt      [facility] Adult PT Treatment:                                                DATE: 03/22/22 Therapeutic Exercise: Nustep L3 UE/LE x 5 minutes  Seated LAQ 10 x 2 - c/o knee pain at end of last set.  Hip flexor stretch -opp leg flexed and pulled to chest options-  QS into towel 5 sec x 10 Right AROM heel slide on slide board x 10- increased anterior knee pain Hamstring  stretch with strap x 3  Blue band clam shell x 20  Banded bridge x 10  Manual Therapy: Effleurage to right quad during modified Lajoy[facility] Lauber Adult PT Treatment:                                                DATE: 03/11/22 Therapeutic Exercise: Demonstrated and issue initial HEP.      Therapeutic Activity: Education on assessment findings that will be addressed throughout duration of POC.          PATIENT EDUCATION:  Education details: see treatment  Person educated: Patient Education method: Explanation, Demonstration, Tactile cues, Verbal cues, and Handouts Education comprehension: verbalized understanding, returned demonstration, verbal cues required, tactile cues required, and needs further education   HOME EXERCISE PROGRAM: Access Code: [email or web address] URL: [email or web address] Date: 03/11/2022 Prepared by: Gwendolyn Grant   Exercises - Modified Thomas Stretch  - 1 x daily - 7 x weekly - 3 sets - 30 sec hold - Seated Long Arc Quad  - 1 x daily - 7 x weekly - 2 sets - 10 reps - Hooklying Clamshell with Resistance  - 1 x daily - 7 x weekly - 2 sets - 10 reps  ASSESSMENT:   CLINICAL IMPRESSION: Patient is a 67 y.o. female who was seen today for physical therapy evaluation and treatment for chronic Rt knee pain. Her signs and symptoms are consistent with knee OA. She is noted to have limited knee flexion AROM, Rt knee strength deficits, bilateral hip weakness, gait abnormalities, and activity limitations secondary to pain. She will benefit from skilled PT to address the above stated deficits in order to optimize her function.    OBJECTIVE IMPAIRMENTS: Abnormal gait, decreased activity tolerance, decreased knowledge of condition, difficulty walking, decreased ROM, decreased strength, improper body mechanics, and pain.    ACTIVITY LIMITATIONS: carrying, lifting, bending, standing, sleeping, and locomotion level   PARTICIPATION LIMITATIONS: meal prep, cleaning,  laundry, shopping, and community activity   PERSONAL FACTORS: Age, Fitness, Time since onset of injury/illness/exacerbation, and 1-2 comorbidities: see PMH above  are also affecting patient's functional outcome.    REHAB POTENTIAL: Good   CLINICAL DECISION MAKING: Stable/uncomplicated   EVALUATION COMPLEXITY: Low     GOALS: Goals reviewed with patient? Yes   SHORT TERM GOALS: Target date: 04/01/2022     Patient will be independent and compliant with initial HEP.    Baseline: issued at eval Goal status: INITIAL   2.  Patient will demonstrate at least 120 degrees of Rt knee flexion AROM to improve ease of sit to stand transfers.  Baseline: see above Goal status: INITIAL   3.  Patient will complete 5 x STS in </= 12 seconds to improve functional strength.  Baseline: see above  Goal status: INITIAL     LONG TERM GOALS: Target date: 04/22/2022       Patient will score at least 67% on FOTO to signify clinically meaningful improvement in functional abilities.    Baseline: see above  Goal status: INITIAL   2.  Patient will walk at least 1500 ft without onset of knee pain during 6 MWT.  Baseline: see above  Goal status: INITIAL   3.  Patient will demonstrate 5/5 Rt knee strength to improve gait stability.  Baseline: see above  Goal status: INITIAL   4.  Patient will report pain at worst rated as </= 3/10 to reduce her current functional limitations.  Baseline: see above  Goal status: INITIAL   5.  Patient will be independent with advanced home program to assist in management of her chronic condition.  Baseline: initial HEP issued  Goal status: INITIAL     PLAN:   PT FREQUENCY: 1-2x/week   PT DURATION: 6 weeks   PLANNED INTERVENTIONS: Therapeutic exercises, Therapeutic activity, Neuromuscular re-education, Balance training, Gait training, Patient/Family education, Self Care, Joint mobilization, Aquatic Therapy, Dry Needling, Cryotherapy, Moist heat, Manual therapy,  and Re-evaluation   PLAN FOR NEXT SESSION: review and progress HEP prn; quad/hamstring strengthening, glute strengthening, progress CKC strengthening as tolerated.     Hessie Diener, PTA 03/22/22 8:09 AM Phone: [phone] Fax: [phone]

## 2022-03-22 NOTE — Therapy (Deleted)
OUTPATIENT PHYSICAL THERAPY TREATMENT NOTE   Patient Name: Sydney Tran MRN: [id number] DOB:[birth year], 67 y.o., female Today's Date: 03/22/2022  PCP: Seward Carol, MD   REFERRING PROVIDER: Pedro Earls, MD  END OF SESSION:   PT End of Session - 03/22/22 0808     Visit Number 2    Number of Visits 13    Date for PT Re-Evaluation 04/27/22    Authorization Type MCR    PT Start Time 0810    PT Stop Time 0842    PT Time Calculation (min) 32 min             Past Medical History:  Diagnosis Date   Allergy    SEASONAL   Asthma    GERD (gastroesophageal reflux disease)    Hypertension    Sleep apnea    Past Surgical History:  Procedure Laterality Date   TONSILLECTOMY     TUBAL LIGATION     Patient Active Problem List   Diagnosis Date Noted   Dyspnea on exertion 04/14/2016   Upper airway cough syndrome 03/15/2016    REFERRING DIAG: Rt knee OA   THERAPY DIAG:  Chronic pain of right knee  Muscle weakness (generalized)  Difficulty in walking, not elsewhere classified  Rationale for Evaluation and Treatment Rehabilitation  PERTINENT HISTORY: ***  PRECAUTIONS: ***  SUBJECTIVE:                                                                                                                                                                                      SUBJECTIVE STATEMENT:  The exercises hurt my knee. I felt it when I got out of bed this morning. 3-4/10 this morning, and 0/10 now while sitting.    PAIN:  Are you having pain? None currently  Pain location: Rt anteromedial knee Pain description: sharp, stiff Aggravating factors: transitioning from sitting to standing, sleep, prolonged walking Relieving factors: topical cream   OBJECTIVE: (objective measures completed at initial evaluation unless otherwise dated)   DIAGNOSTIC FINDINGS: Rt knee X-ray: FINDINGS: No evidence of fracture, dislocation, or joint effusion. There is tricompartmental  joint space narrowing, sclerosis and osteophytes consistent with degenerative joint disease. No evidence of effusion.   IMPRESSION: Degenerative changes.   PATIENT SURVEYS:  FOTO 61% function to 67% predicted   COGNITION: Overall cognitive status: Within functional limits for tasks assessed                         SENSATION: Not tested   EDEMA:  No obvious swelling about the knee    MUSCLE LENGTH: Hamstrings: mild tightness bilaterally    POSTURE: No  Significant postural limitations   PALPATION: No palpable tenderness about Rt knee    LOWER EXTREMITY ROM:   Active ROM Right eval Left eval  Hip flexion      Hip extension      Hip abduction      Hip adduction      Hip internal rotation      Hip external rotation      Knee flexion 114 123  Knee extension      Ankle dorsiflexion      Ankle plantarflexion      Ankle inversion      Ankle eversion       (Blank rows = not tested)   LOWER EXTREMITY MMT:   MMT Right eval Left eval  Hip flexion 4- 4  Hip extension 4 4  Hip abduction 4 4+  Hip adduction      Hip internal rotation      Hip external rotation      Knee flexion 4- 5  Knee extension 4 5  Ankle dorsiflexion      Ankle plantarflexion      Ankle inversion      Ankle eversion       (Blank rows = not tested)   LOWER EXTREMITY SPECIAL TESTS:  (+) Ely (-) valgus, varus, anterior drawer, posterior drawer, McMurray's    FUNCTIONIAL TESTS:  5 x STS: 16.6 seconds knee pain  6 MWT: 1124 ft pain after 4.5 minutes   GAIT: Distance walked: 6MWT Assistive device utilized: None Level of assistance: Complete Independence Comments: Trendelenburg Rt      [facility] Adult PT Treatment:                                                DATE: 03/22/22 Therapeutic Exercise: Nustep L3 UE/LE x 5 minutes  Seated LAQ 10 x 2 - c/o knee pain at end of last set.  Hip flexor stretch -opp leg flexed and pulled to chest options-  QS into towel 5 sec x 10 Right AROM heel slide  on slide board x 10- increased anterior knee pain Hamstring stretch with strap x 3  Blue band clam shell x 20  Banded bridge x 10  Manual Therapy: Effleurage to right quad during modified Lajoyce Lauber Adult PT Treatment:                                                DATE: 03/11/22 Therapeutic Exercise: Demonstrated and issue initial HEP.      Therapeutic Activity: Education on assessment findings that will be addressed throughout duration of POC.          PATIENT EDUCATION:  Education details: see treatment  Person educated: Patient Education method: Explanation, Demonstration, Tactile cues, Verbal cues, and Handouts Education comprehension: verbalized understanding, returned demonstration, verbal cues required, tactile cues required, and needs further education   HOME EXERCISE PROGRAM: Access Code: [email or web address] URL: [email or web address] Date: 03/11/2022 Prepared by: Gwendolyn Grant   Exercises - Modified Thomas Stretch  - 1 x daily - 7 x weekly - 3 sets - 30 sec hold - Seated Long Arc Quad  - 1 x daily - 7 x weekly - 2 sets - 10 reps -  Hooklying Clamshell with Resistance  - 1 x daily - 7 x weekly - 2 sets - 10 reps   ASSESSMENT:   CLINICAL IMPRESSION: Patient is a 67 y.o. female who was seen today for physical therapy evaluation and treatment for chronic Rt knee pain. Her signs and symptoms are consistent with knee OA. She is noted to have limited knee flexion AROM, Rt knee strength deficits, bilateral hip weakness, gait abnormalities, and activity limitations secondary to pain. She will benefit from skilled PT to address the above stated deficits in order to optimize her function.    OBJECTIVE IMPAIRMENTS: Abnormal gait, decreased activity tolerance, decreased knowledge of condition, difficulty walking, decreased ROM, decreased strength, improper body mechanics, and pain.    ACTIVITY LIMITATIONS: carrying, lifting, bending, standing, sleeping, and locomotion  level   PARTICIPATION LIMITATIONS: meal prep, cleaning, laundry, shopping, and community activity   PERSONAL FACTORS: Age, Fitness, Time since onset of injury/illness/exacerbation, and 1-2 comorbidities: see PMH above  are also affecting patient's functional outcome.    REHAB POTENTIAL: Good   CLINICAL DECISION MAKING: Stable/uncomplicated   EVALUATION COMPLEXITY: Low     GOALS: Goals reviewed with patient? Yes   SHORT TERM GOALS: Target date: 04/01/2022     Patient will be independent and compliant with initial HEP.    Baseline: issued at eval Goal status: INITIAL   2.  Patient will demonstrate at least 120 degrees of Rt knee flexion AROM to improve ease of sit to stand transfers.  Baseline: see above Goal status: INITIAL   3.  Patient will complete 5 x STS in </= 12 seconds to improve functional strength.  Baseline: see above  Goal status: INITIAL     LONG TERM GOALS: Target date: 04/22/2022       Patient will score at least 67% on FOTO to signify clinically meaningful improvement in functional abilities.    Baseline: see above  Goal status: INITIAL   2.  Patient will walk at least 1500 ft without onset of knee pain during 6 MWT.  Baseline: see above  Goal status: INITIAL   3.  Patient will demonstrate 5/5 Rt knee strength to improve gait stability.  Baseline: see above  Goal status: INITIAL   4.  Patient will report pain at worst rated as </= 3/10 to reduce her current functional limitations.  Baseline: see above  Goal status: INITIAL   5.  Patient will be independent with advanced home program to assist in management of her chronic condition.  Baseline: initial HEP issued  Goal status: INITIAL     PLAN:   PT FREQUENCY: 1-2x/week   PT DURATION: 6 weeks   PLANNED INTERVENTIONS: Therapeutic exercises, Therapeutic activity, Neuromuscular re-education, Balance training, Gait training, Patient/Family education, Self Care, Joint mobilization, Aquatic  Therapy, Dry Needling, Cryotherapy, Moist heat, Manual therapy, and Re-evaluation   PLAN FOR NEXT SESSION: review and progress HEP prn; quad/hamstring strengthening, glute strengthening, progress CKC strengthening as tolerated.     Hessie Diener, PTA 03/22/22 2:02 PM Phone: [phone] Fax: [phone]

## 2022-03-25 DIAGNOSIS — M25561 Pain in right knee: Secondary | ICD-10-CM | POA: Diagnosis not present

## 2022-03-25 DIAGNOSIS — M8580 Other specified disorders of bone density and structure, unspecified site: Secondary | ICD-10-CM | POA: Diagnosis not present

## 2022-03-25 DIAGNOSIS — I10 Essential (primary) hypertension: Secondary | ICD-10-CM | POA: Diagnosis not present

## 2022-04-03 ENCOUNTER — Encounter: Payer: Self-pay | Admitting: Physical Therapy

## 2022-04-03 ENCOUNTER — Ambulatory Visit: Payer: Medicare Other | Attending: Internal Medicine | Admitting: Physical Therapy

## 2022-04-03 DIAGNOSIS — M25561 Pain in right knee: Secondary | ICD-10-CM | POA: Diagnosis not present

## 2022-04-03 DIAGNOSIS — G8929 Other chronic pain: Secondary | ICD-10-CM | POA: Insufficient documentation

## 2022-04-03 DIAGNOSIS — M6281 Muscle weakness (generalized): Secondary | ICD-10-CM | POA: Diagnosis not present

## 2022-04-03 DIAGNOSIS — R262 Difficulty in walking, not elsewhere classified: Secondary | ICD-10-CM | POA: Diagnosis not present

## 2022-04-03 NOTE — Therapy (Signed)
OUTPATIENT PHYSICAL THERAPY TREATMENT NOTE   Patient Name: Sydney Tran MRN: [id number] DOB:10-16-1955, 67 y.o., female Today's Date: 04/03/2022  PCP: Seward Carol, MD   REFERRING PROVIDER: Pedro Earls, MD  END OF SESSION:   PT End of Session - 04/03/22 1112     Visit Number 3    Number of Visits 13    Date for PT Re-Evaluation 04/27/22    Authorization Type MCR    Progress Note Due on Visit 10    PT Start Time 1110    PT Stop Time 1148    PT Time Calculation (min) 38 min             Past Medical History:  Diagnosis Date   Allergy    SEASONAL   Asthma    GERD (gastroesophageal reflux disease)    Hypertension    Sleep apnea    Past Surgical History:  Procedure Laterality Date   TONSILLECTOMY     TUBAL LIGATION     Patient Active Problem List   Diagnosis Date Noted   Dyspnea on exertion 04/14/2016   Upper airway cough syndrome 03/15/2016    REFERRING DIAG: Rt knee OA   THERAPY DIAG:  Chronic pain of right knee  Muscle weakness (generalized)  Difficulty in walking, not elsewhere classified  Rationale for Evaluation and Treatment Rehabilitation  PERTINENT HISTORY: Asthma  Hypertension  PRECAUTIONS: None   WEIGHT BEARING RESTRICTIONS: No  SUBJECTIVE:                                                                                                                                                                                      SUBJECTIVE STATEMENT:  I'm still having pain at the inside of the knee and it is now also above the knee and behind the knee. I walked 3 miles and it didn't bother me one day but some days it starts hurting after half a mile. Hurts more on steps.    PAIN:  Are you having pain? None currently Yes: NPRS scale:4.5 Pain location: Rt anteromedial knee- also superior and posterior knee.  Pain description: sharp, stiff Aggravating factors: transitioning from sitting to standing, sleep, prolonged walking Relieving factors:  topical cream   OBJECTIVE: (objective measures completed at initial evaluation unless otherwise dated)    DIAGNOSTIC FINDINGS: Rt knee X-ray: FINDINGS: No evidence of fracture, dislocation, or joint effusion. There is tricompartmental joint space narrowing, sclerosis and osteophytes consistent with degenerative joint disease. No evidence of effusion.   IMPRESSION: Degenerative changes.   PATIENT SURVEYS:  FOTO 61% function to 67% predicted   COGNITION: Overall cognitive status: Within functional limits for tasks assessed  SENSATION: Not tested   EDEMA:  No obvious swelling about the knee    MUSCLE LENGTH: Hamstrings: mild tightness bilaterally    POSTURE: No Significant postural limitations   PALPATION: No palpable tenderness about Rt knee    LOWER EXTREMITY ROM:   Active ROM Right eval Left eval Right  04/03/22  Hip flexion       Hip extension       Hip abduction       Hip adduction       Hip internal rotation       Hip external rotation       Knee flexion 114 123 130  Knee extension       Ankle dorsiflexion       Ankle plantarflexion       Ankle inversion       Ankle eversion        (Blank rows = not tested)   LOWER EXTREMITY MMT:   MMT Right eval Left eval  Hip flexion 4- 4  Hip extension 4 4  Hip abduction 4 4+  Hip adduction      Hip internal rotation      Hip external rotation      Knee flexion 4- 5  Knee extension 4 5  Ankle dorsiflexion      Ankle plantarflexion      Ankle inversion      Ankle eversion       (Blank rows = not tested)   LOWER EXTREMITY SPECIAL TESTS:  (+) Ely (-) valgus, varus, anterior drawer, posterior drawer, McMurray's    FUNCTIONIAL TESTS:  5 x STS: 16.6 seconds knee pain ; 04/03/22: 9.8 sec  6 MWT: 1124 ft pain after 4.5 minutes   GAIT: Distance walked: 6MWT Assistive device utilized: None Level of assistance: Complete Independence Comments: Trendelenburg Rt   [facility] Adult PT  Treatment:                                                DATE: 04/03/22 Therapeutic Exercise: Nustep L5 UE/LE x 5 min 5 STS 98 sec  LAQ 10 x 2 - more fatigue Supine Bridge  Blue band clam supine x 20 Supine feet on ball ; h/s curls 10 x 2- pain with full ext   QS into towel 5 sec x 10 Hamstring stretch 3 x 30 sec strap Hip flexor stretch 3 x 30 sec     [facility] Adult PT Treatment:                                                DATE: 03/22/22 Therapeutic Exercise: Nustep L3 UE/LE x 5 minutes  Seated LAQ 10 x 2 - c/o knee pain at end of last set Hip flexor stretch -opp leg flexed and pulled to chest options-  QS into towel 5 sec x 10 Right AROM heel slide on slide board x 10- increased anterior knee pain Hamstring stretch with strap x 3  Blue band clam shell x 20  Banded bridge x 10   Manual Therapy: Effleurage to right quad during modified thomas Patella mobs   [facility] Adult PT Treatment:  DATE: 03/11/22 Therapeutic Exercise: Demonstrated and issue initial HEP.      Therapeutic Activity: Education on assessment findings that will be addressed throughout duration of POC.          PATIENT EDUCATION:  Education details: see treatment  Person educated: Patient Education method: Explanation, Demonstration, Tactile cues, Verbal cues, and Handouts Education comprehension: verbalized understanding, returned demonstration, verbal cues required, tactile cues required, and needs further education   HOME EXERCISE PROGRAM: Access Code: [email or web address] URL: [email or web address] Date: 03/11/2022 Prepared by: Gwendolyn Grant   Exercises - Modified Thomas Stretch  - 1 x daily - 7 x weekly - 3 sets - 30 sec hold - Seated Long Arc Quad  - 1 x daily - 7 x weekly - 2 sets - 10 reps - Hooklying Clamshell with Resistance  - 1 x daily - 7 x weekly - 2 sets - 10 reps Added 03/22/22 Banded bridge 10 reps, 2 sets  04/03/22 - Sit to stand with  control  - 1 x daily - 7 x weekly - 1-2 sets - 10 reps   ASSESSMENT:   CLINICAL IMPRESSION: Patient is a 67 y.o. female who was seen today for physical therapy treatment for chronic Rt knee pain. Her signs and symptoms are consistent with knee OA. Today she arrives for 2 nd treatment sine evaluation and reports that her pain seems worse and has spread superiorly and posteriorly. Functionally, she has improved her 5 x STS and her knee ROM has improved from 114 d to 130 d. Continued with knee strengthening and hip strengthening. She tolerated session without adverse effects. She has met all STGs. She will benefit from skilled PT to address the above stated deficits in order to optimize her function.    OBJECTIVE IMPAIRMENTS: Abnormal gait, decreased activity tolerance, decreased knowledge of condition, difficulty walking, decreased ROM, decreased strength, improper body mechanics, and pain.    ACTIVITY LIMITATIONS: carrying, lifting, bending, standing, sleeping, and locomotion level   PARTICIPATION LIMITATIONS: meal prep, cleaning, laundry, shopping, and community activity   PERSONAL FACTORS: Age, Fitness, Time since onset of injury/illness/exacerbation, and 1-2 comorbidities: see PMH above  are also affecting patient's functional outcome.    REHAB POTENTIAL: Good   CLINICAL DECISION MAKING: Stable/uncomplicated   EVALUATION COMPLEXITY: Low     GOALS: Goals reviewed with patient? Yes   SHORT TERM GOALS: Target date: 04/01/2022     Patient will be independent and compliant with initial HEP.    Baseline: issued at eval Goal status: MET   2.  Patient will demonstrate at least 120 degrees of Rt knee flexion AROM to improve ease of sit to stand transfers.  Baseline: see above 04/03/22: 130 Goal status: MET   3.  Patient will complete 5 x STS in </= 12 seconds to improve functional strength.  Baseline: see above  04/03/22: 9.8 sec no pain , no UE assist Goal status: MET      LONG TERM  GOALS: Target date: 04/22/2022       Patient will score at least 67% on FOTO to signify clinically meaningful improvement in functional abilities.    Baseline: see above  Goal status: INITIAL   2.  Patient will walk at least 1500 ft without onset of knee pain during 6 MWT.  Baseline: see above  Goal status: INITIAL   3.  Patient will demonstrate 5/5 Rt knee strength to improve gait stability.  Baseline: see above  Goal status: INITIAL   4.  Patient will report pain  at worst rated as </= 3/10 to reduce her current functional limitations.  Baseline: see above  Goal status: INITIAL   5.  Patient will be independent with advanced home program to assist in management of her chronic condition.  Baseline: initial HEP issued  Goal status: INITIAL     PLAN:   PT FREQUENCY: 1-2x/week   PT DURATION: 6 weeks   PLANNED INTERVENTIONS: Therapeutic exercises, Therapeutic activity, Neuromuscular re-education, Balance training, Gait training, Patient/Family education, Self Care, Joint mobilization, Aquatic Therapy, Dry Needling, Cryotherapy, Moist heat, Manual therapy, and Re-evaluation   PLAN FOR NEXT SESSION: review and progress HEP prn; quad/hamstring strengthening, glute strengthening, progress CKC strengthening as tolerated.     Hessie Diener, PTA 04/03/22 1:21 PM Phone: [phone] Fax: [phone]

## 2022-04-10 ENCOUNTER — Ambulatory Visit: Payer: Medicare Other | Admitting: Physical Therapy

## 2022-04-10 ENCOUNTER — Encounter: Payer: Self-pay | Admitting: Physical Therapy

## 2022-04-10 DIAGNOSIS — G8929 Other chronic pain: Secondary | ICD-10-CM | POA: Diagnosis not present

## 2022-04-10 DIAGNOSIS — M25561 Pain in right knee: Secondary | ICD-10-CM | POA: Diagnosis not present

## 2022-04-10 DIAGNOSIS — M6281 Muscle weakness (generalized): Secondary | ICD-10-CM | POA: Diagnosis not present

## 2022-04-10 DIAGNOSIS — R262 Difficulty in walking, not elsewhere classified: Secondary | ICD-10-CM | POA: Diagnosis not present

## 2022-04-10 NOTE — Therapy (Signed)
OUTPATIENT PHYSICAL THERAPY TREATMENT NOTE   Patient Name: Sydney Tran MRN: [id number] DOB:Feb 17, 1955, 67 y.o., female Today's Date: 04/10/2022  PCP: Seward Carol, MD   REFERRING PROVIDER: Pedro Earls, MD  END OF SESSION:   PT End of Session - 04/10/22 1104     Visit Number 4    Number of Visits 13    Authorization Type MCR    Progress Note Due on Visit 10    PT Start Time 1102    PT Stop Time 1145    PT Time Calculation (min) 43 min              Past Medical History:  Diagnosis Date   Allergy    SEASONAL   Asthma    GERD (gastroesophageal reflux disease)    Hypertension    Sleep apnea    Past Surgical History:  Procedure Laterality Date   TONSILLECTOMY     TUBAL LIGATION     Patient Active Problem List   Diagnosis Date Noted   Dyspnea on exertion 04/14/2016   Upper airway cough syndrome 03/15/2016    REFERRING DIAG: Rt knee OA   THERAPY DIAG:  Chronic pain of right knee  Muscle weakness (generalized)  Difficulty in walking, not elsewhere classified  Rationale for Evaluation and Treatment Rehabilitation  PERTINENT HISTORY: Asthma  Hypertension  PRECAUTIONS: None   WEIGHT BEARING RESTRICTIONS: No  SUBJECTIVE:                                                                                                                                                                                      SUBJECTIVE STATEMENT: The knee feels better, not as much pain. No issues with my 2 mile walk yesterday. No more pain at night. No pain with stairs. I cannot line dance for very long, I get a twinge. I still get a pain if I sit too long and get up.     PAIN:  Are you having pain? None currently Yes: NPRS scale: 0/10 Pain location: Rt anteromedial knee- also superior and posterior knee.  Pain description: sharp, stiff Aggravating factors: transitioning from sitting to standing, sleep, prolonged walking Relieving factors: topical cream   OBJECTIVE:  (objective measures completed at initial evaluation unless otherwise dated)    DIAGNOSTIC FINDINGS: Rt knee X-ray: FINDINGS: No evidence of fracture, dislocation, or joint effusion. There is tricompartmental joint space narrowing, sclerosis and osteophytes consistent with degenerative joint disease. No evidence of effusion.   IMPRESSION: Degenerative changes.   PATIENT SURVEYS:  FOTO 61% function to 67% predicted   04/10/22: 67%  COGNITION: Overall cognitive status: Within functional limits for tasks assessed  SENSATION: Not tested   EDEMA:  No obvious swelling about the knee    MUSCLE LENGTH: Hamstrings: mild tightness bilaterally    POSTURE: No Significant postural limitations   PALPATION: No palpable tenderness about Rt knee    LOWER EXTREMITY ROM:   Active ROM Right eval Left eval Right  04/03/22  Hip flexion       Hip extension       Hip abduction       Hip adduction       Hip internal rotation       Hip external rotation       Knee flexion 114 123 130  Knee extension       Ankle dorsiflexion       Ankle plantarflexion       Ankle inversion       Ankle eversion        (Blank rows = not tested)   LOWER EXTREMITY MMT:   MMT Right eval Left eval Right 04/10/22  Hip flexion 4- 4 4+  Hip extension 4 4   Hip abduction 4 4+   Hip adduction       Hip internal rotation       Hip external rotation       Knee flexion 4- 5 5  Knee extension 4 5 4+  Ankle dorsiflexion       Ankle plantarflexion       Ankle inversion       Ankle eversion        (Blank rows = not tested)   LOWER EXTREMITY SPECIAL TESTS:  (+) Ely (-) valgus, varus, anterior drawer, posterior drawer, McMurray's    FUNCTIONIAL TESTS:  5 x STS: 16.6 seconds knee pain ; 04/03/22: 9.8 sec  6 MWT: 1124 ft pain after 4.5 minutes 04/10/22: Left SLS 37 sec, Right 45 sec   GAIT: Distance walked: 6MWT Assistive device utilized: None Level of assistance: Complete  Independence Comments: Trendelenburg Rt   [facility] Adult PT Treatment:                                                DATE: 04/10/22 Therapeutic Exercise: Rec bike L1 x 5 minutes  STS from bariatric x 10  LAQ 10 x 2 Right-palpable medial knee dysfunction 4 inch step up x 10 4 inch lateral step down x 10 4 inch retro step up x 10 SLS 37 sec left, 45 sec right SLR 5 x 2 each SLR with ER 5 x 2 each- fatigues quickly on right.    [facility] Adult PT Treatment:                                                DATE: 04/03/22 Therapeutic Exercise: Nustep L5 UE/LE x 5 min 5 STS 9.8 sec  LAQ 10 x 2 - more fatigue Supine Bridge  Blue band clam supine x 20 Supine feet on ball ; h/s curls 10 x 2- pain with full ext   QS into towel 5 sec x 10 Hamstring stretch 3 x 30 sec strap Hip flexor stretch 3 x 30 sec     [facility] Adult PT Treatment:  DATE: 03/22/22 Therapeutic Exercise: Nustep L3 UE/LE x 5 minutes  Seated LAQ 10 x 2 - c/o knee pain at end of last set Hip flexor stretch -opp leg flexed and pulled to chest options-  QS into towel 5 sec x 10 Right AROM heel slide on slide board x 10- increased anterior knee pain Hamstring stretch with strap x 3  Blue band clam shell x 20  Banded bridge x 10   Manual Therapy: Effleurage to right quad during modified thomas Patella mobs   [facility] Adult PT Treatment:                                                DATE: 03/11/22 Therapeutic Exercise: Demonstrated and issue initial HEP.      Therapeutic Activity: Education on assessment findings that will be addressed throughout duration of POC.          PATIENT EDUCATION:  Education details: see treatment  Person educated: Patient Education method: Explanation, Demonstration, Tactile cues, Verbal cues, and Handouts Education comprehension: verbalized understanding, returned demonstration, verbal cues required, tactile cues required, and needs further education    HOME EXERCISE PROGRAM: Access Code: [email or web address] URL: [email or web address] Date: 03/11/2022 Prepared by: Gwendolyn Grant   Exercises - Modified Thomas Stretch  - 1 x daily - 7 x weekly - 3 sets - 30 sec hold - Seated Long Arc Quad  - 1 x daily - 7 x weekly - 2 sets - 10 reps - Hooklying Clamshell with Resistance  - 1 x daily - 7 x weekly - 2 sets - 10 reps Added 03/22/22 Banded bridge 10 reps, 2 sets  04/03/22 - Sit to stand with control  - 1 x daily - 7 x weekly - 1-2 sets - 10 reps Added 04/10/22 -- Active straight leg raise  - 1 x daily - 7 x weekly - 1-2 sets - 5-10 reps - Straight Leg Raise with External Rotation  - 1 x daily - 7 x weekly - 1-2 sets - 5-10 reps   ASSESSMENT:   CLINICAL IMPRESSION: Patient is a 67 y.o. female who was seen today for physical therapy treatment for chronic Rt knee pain. Her signs and symptoms are consistent with knee OA. Today she arrives for 3 rd treatment since evaluation and reports that her pain has improved. She is walking 2-3 miles without an increase in knee pain. Her MMT has improved and FOTO improved. She does have pain with line dancing and pain after sitting too long then stands. Progressed with closed chain strength and balance to work toward endurance to line dancing. Palpable medial knee dysfunction noted during seated knee extension. Was found to have weakness medial quad when performing SLR with ER. This was added to HEP.     OBJECTIVE IMPAIRMENTS: Abnormal gait, decreased activity tolerance, decreased knowledge of condition, difficulty walking, decreased ROM, decreased strength, improper body mechanics, and pain.    ACTIVITY LIMITATIONS: carrying, lifting, bending, standing, sleeping, and locomotion level   PARTICIPATION LIMITATIONS: meal prep, cleaning, laundry, shopping, and community activity   PERSONAL FACTORS: Age, Fitness, Time since onset of injury/illness/exacerbation, and 1-2 comorbidities: see PMH above  are also  affecting patient's functional outcome.    REHAB POTENTIAL: Good   CLINICAL DECISION MAKING: Stable/uncomplicated   EVALUATION COMPLEXITY: Low     GOALS: Goals reviewed with patient? Yes   SHORT  TERM GOALS: Target date: 04/01/2022     Patient will be independent and compliant with initial HEP.    Baseline: issued at eval Goal status: MET   2.  Patient will demonstrate at least 120 degrees of Rt knee flexion AROM to improve ease of sit to stand transfers.  Baseline: see above 04/03/22: 130 Goal status: MET   3.  Patient will complete 5 x STS in </= 12 seconds to improve functional strength.  Baseline: see above  04/03/22: 9.8 sec no pain , no UE assist Goal status: MET      LONG TERM GOALS: Target date: 04/22/2022       Patient will score at least 67% on FOTO to signify clinically meaningful improvement in functional abilities.    Baseline: see above  04/10/22: 67% Goal status: MET   2.  Patient will walk at least 1500 ft without onset of knee pain during 6 MWT.  Baseline: see above 04/10/22: walking 2 miles, no pain  Goal status: MET   3.  Patient will demonstrate 5/5 Rt knee strength to improve gait stability.  Baseline: see above  04/10/22: 5/5 right knee  Goal status: MET    4.  Patient will report pain at worst rated as </= 3/10 to reduce her current functional limitations.  Baseline: see above  04/10/22: 3/10 at most Goal status: MET    5.  Patient will be independent with advanced home program to assist in management of her chronic condition.  Baseline: initial HEP issued  Goal status: ONGOING     PLAN:   PT FREQUENCY: 1-2x/week   PT DURATION: 6 weeks   PLANNED INTERVENTIONS: Therapeutic exercises, Therapeutic activity, Neuromuscular re-education, Balance training, Gait training, Patient/Family education, Self Care, Joint mobilization, Aquatic Therapy, Dry Needling, Cryotherapy, Moist heat, Manual therapy, and Re-evaluation   PLAN FOR NEXT SESSION:  progress single leg stability.  review and progress HEP prn; quad/hamstring strengthening, glute strengthening, progress CKC strengthening as tolerated (wants to line dance without Pain).     Hessie Diener, PTA 04/10/22 11:17 AM Phone: [phone] Fax: [phone]

## 2022-04-17 ENCOUNTER — Encounter: Payer: Self-pay | Admitting: Physical Therapy

## 2022-04-17 ENCOUNTER — Ambulatory Visit: Payer: Medicare Other | Admitting: Physical Therapy

## 2022-04-17 DIAGNOSIS — G8929 Other chronic pain: Secondary | ICD-10-CM | POA: Diagnosis not present

## 2022-04-17 DIAGNOSIS — M25561 Pain in right knee: Secondary | ICD-10-CM | POA: Diagnosis not present

## 2022-04-17 DIAGNOSIS — M6281 Muscle weakness (generalized): Secondary | ICD-10-CM | POA: Diagnosis not present

## 2022-04-17 DIAGNOSIS — R262 Difficulty in walking, not elsewhere classified: Secondary | ICD-10-CM

## 2022-04-17 NOTE — Therapy (Signed)
OUTPATIENT PHYSICAL THERAPY TREATMENT NOTE   Patient Name: Sydney Tran MRN: [id number] DOB:1955/08/13, 67 y.o., female Today's Date: 04/17/2022  PCP: Seward Carol, MD   REFERRING PROVIDER: Pedro Earls, MD  END OF SESSION:   PT End of Session - 04/17/22 1109     Visit Number 5    Number of Visits 13    Date for PT Re-Evaluation 04/27/22    Authorization Type MCR    PT Start Time 1108    PT Stop Time 1146    PT Time Calculation (min) 38 min              Past Medical History:  Diagnosis Date   Allergy    SEASONAL   Asthma    GERD (gastroesophageal reflux disease)    Hypertension    Sleep apnea    Past Surgical History:  Procedure Laterality Date   TONSILLECTOMY     TUBAL LIGATION     Patient Active Problem List   Diagnosis Date Noted   Dyspnea on exertion 04/14/2016   Upper airway cough syndrome 03/15/2016    REFERRING DIAG: Rt knee OA   THERAPY DIAG:  Chronic pain of right knee  Muscle weakness (generalized)  Difficulty in walking, not elsewhere classified  Rationale for Evaluation and Treatment Rehabilitation  PERTINENT HISTORY: Asthma  Hypertension  PRECAUTIONS: None   WEIGHT BEARING RESTRICTIONS: No  SUBJECTIVE:                                                                                                                                                                                      SUBJECTIVE STATEMENT: The knee feels better, until this morning. I went to water aerobics yesterday. Not sure if that or lying on my side aggravted the inside knee. 2/10 today.   Previous subjective 04/10/22: No issues with my 2 mile walk yesterday. No more pain at night. No pain with stairs. I cannot line-dance for very long, I get a twinge. I still get a pain if I sit too long and get up.     PAIN:  Are you having pain? None currently Yes: NPRS scale: 2/10 Pain location: Rt anteromedial knee- also superior and posterior knee.  Pain  description: sharp, stiff Aggravating factors: transitioning from sitting to standing, sleep, prolonged walking Relieving factors: topical cream   OBJECTIVE: (objective measures completed at initial evaluation unless otherwise dated)    DIAGNOSTIC FINDINGS: Rt knee X-ray: FINDINGS: No evidence of fracture, dislocation, or joint effusion. There is tricompartmental joint space narrowing, sclerosis and osteophytes consistent with degenerative joint disease. No evidence of effusion.   IMPRESSION: Degenerative changes.   PATIENT SURVEYS:  FOTO  61% function to 67% predicted   04/10/22: 67%  COGNITION: Overall cognitive status: Within functional limits for tasks assessed                         SENSATION: Not tested   EDEMA:  No obvious swelling about the knee    MUSCLE LENGTH: Hamstrings: mild tightness bilaterally    POSTURE: No Significant postural limitations   PALPATION: No palpable tenderness about Rt knee    LOWER EXTREMITY ROM:   Active ROM Right eval Left eval Right  04/03/22  Hip flexion       Hip extension       Hip abduction       Hip adduction       Hip internal rotation       Hip external rotation       Knee flexion 114 123 130  Knee extension       Ankle dorsiflexion       Ankle plantarflexion       Ankle inversion       Ankle eversion        (Blank rows = not tested)   LOWER EXTREMITY MMT:   MMT Right eval Left eval Right 04/10/22  Hip flexion 4- 4 4+  Hip extension 4 4   Hip abduction 4 4+   Hip adduction       Hip internal rotation       Hip external rotation       Knee flexion 4- 5 5  Knee extension 4 5 4+  Ankle dorsiflexion       Ankle plantarflexion       Ankle inversion       Ankle eversion        (Blank rows = not tested)   LOWER EXTREMITY SPECIAL TESTS:  (+) Ely (-) valgus, varus, anterior drawer, posterior drawer, McMurray's    FUNCTIONIAL TESTS:  5 x STS: 16.6 seconds knee pain ; 04/03/22: 9.8 sec  6 MWT: 1124 ft pain  after 4.5 minutes 04/10/22: Left SLS 37 sec, Right 45 sec   GAIT: Distance walked: 6MWT Assistive device utilized: None Level of assistance: Complete Independence Comments: Trendelenburg Rt   [facility] Adult PT Treatment:                                                DATE: 04/17/22 Therapeutic Exercise: Nustep L5 x 5 min Tandem stance on AIREX  Alternating step up onto AIREX pad  SLS on AIREX  15 sec right Wall squat x 8 , 5 sec each  LAQ x 10, added red band 10 x 2  SLR x 10 each  SLR with ER x 10 each  Right hip flexor stretch 3 x 30 sec  Right hamstring stretch 3 x 30 sec, supine with strap    [facility] Adult PT Treatment:                                                DATE: 04/10/22 Therapeutic Exercise: Rec bike L1 x 5 minutes  STS from bariatric x 10  LAQ 10 x 2 Right-palpable medial knee dysfunction 4 inch step up x 10 4 inch  lateral step down x 10 4 inch retro step up x 10 SLS 37 sec left, 45 sec right SLR 5 x 2 each SLR with ER 5 x 2 each- fatigues quickly on right.    [facility] Adult PT Treatment:                                                DATE: 04/03/22 Therapeutic Exercise: Nustep L5 UE/LE x 5 min 5 STS 9.8 sec  LAQ 10 x 2 - more fatigue Supine Bridge  Blue band clam supine x 20 Supine feet on ball ; h/s curls 10 x 2- pain with full ext   QS into towel 5 sec x 10 Hamstring stretch 3 x 30 sec strap Hip flexor stretch 3 x 30 sec     [facility] Adult PT Treatment:                                                DATE: 03/22/22 Therapeutic Exercise: Nustep L3 UE/LE x 5 minutes  Seated LAQ 10 x 2 - c/o knee pain at end of last set Hip flexor stretch -opp leg flexed and pulled to chest options-  QS into towel 5 sec x 10 Right AROM heel slide on slide board x 10- increased anterior knee pain Hamstring stretch with strap x 3  Blue band clam shell x 20  Banded bridge x 10   Manual Therapy: Effleurage to right quad during modified thomas Patella mobs          PATIENT  EDUCATION:  Education details: see treatment  Person educated: Patient Education method: Explanation, Demonstration, Tactile cues, Verbal cues, and Handouts Education comprehension: verbalized understanding, returned demonstration, verbal cues required, tactile cues required, and needs further education   HOME EXERCISE PROGRAM: Access Code: [email or web address] URL: [email or web address] Date: 03/11/2022 Prepared by: Gwendolyn Grant   Exercises - Modified Thomas Stretch  - 1 x daily - 7 x weekly - 3 sets - 30 sec hold - Seated Long Arc Quad  - 1 x daily - 7 x weekly - 2 sets - 10 reps - Hooklying Clamshell with Resistance  - 1 x daily - 7 x weekly - 2 sets - 10 reps Added 03/22/22 Banded bridge 10 reps, 2 sets  04/03/22 - Sit to stand with control  - 1 x daily - 7 x weekly - 1-2 sets - 10 reps Added 04/10/22 -- Active straight leg raise  - 1 x daily - 7 x weekly - 1-2 sets - 5-10 reps - Straight Leg Raise with External Rotation  - 1 x daily - 7 x weekly - 1-2 sets - 5-10 reps 04/17/22 - Single Leg Stance on Foam Pad  - 1 x daily - 7 x weekly - 1 sets - 3 reps - 30 hold   ASSESSMENT:   CLINICAL IMPRESSION: Patient is a 67 y.o. female who was seen today for physical therapy treatment for chronic Rt knee pain. Her signs and symptoms are consistent with knee OA. Today she arrives for 4 th treatment since evaluation and reports that her pain has improved however medial knee is a little painful this morning, possibly due to sleep positioning. Continued with quad strengthening and closed chain stability.  She no longer demonstrates palpable shifting in medial knee with LAQ, able to add light resistance band. Updated HEP with SLS.      OBJECTIVE IMPAIRMENTS: Abnormal gait, decreased activity tolerance, decreased knowledge of condition, difficulty walking, decreased ROM, decreased strength, improper body mechanics, and pain.    ACTIVITY LIMITATIONS: carrying, lifting, bending, standing, sleeping,  and locomotion level   PARTICIPATION LIMITATIONS: meal prep, cleaning, laundry, shopping, and community activity   PERSONAL FACTORS: Age, Fitness, Time since onset of injury/illness/exacerbation, and 1-2 comorbidities: see PMH above  are also affecting patient's functional outcome.    REHAB POTENTIAL: Good   CLINICAL DECISION MAKING: Stable/uncomplicated   EVALUATION COMPLEXITY: Low     GOALS: Goals reviewed with patient? Yes   SHORT TERM GOALS: Target date: 04/01/2022     Patient will be independent and compliant with initial HEP.    Baseline: issued at eval Goal status: MET   2.  Patient will demonstrate at least 120 degrees of Rt knee flexion AROM to improve ease of sit to stand transfers.  Baseline: see above 04/03/22: 130 Goal status: MET   3.  Patient will complete 5 x STS in </= 12 seconds to improve functional strength.  Baseline: see above  04/03/22: 9.8 sec no pain , no UE assist Goal status: MET      LONG TERM GOALS: Target date: 04/22/2022       Patient will score at least 67% on FOTO to signify clinically meaningful improvement in functional abilities.    Baseline: see above  04/10/22: 67% Goal status: MET   2.  Patient will walk at least 1500 ft without onset of knee pain during 6 MWT.  Baseline: see above 04/10/22: walking 2 miles, no pain  Goal status: MET   3.  Patient will demonstrate 5/5 Rt knee strength to improve gait stability.  Baseline: see above  04/10/22: 5/5 right knee  Goal status: MET    4.  Patient will report pain at worst rated as </= 3/10 to reduce her current functional limitations.  Baseline: see above  04/10/22: 3/10 at most Goal status: MET    5.  Patient will be independent with advanced home program to assist in management of her chronic condition.  Baseline: initial HEP issued  Goal status: ONGOING     PLAN:   PT FREQUENCY: 1-2x/week   PT DURATION: 6 weeks   PLANNED INTERVENTIONS: Therapeutic exercises, Therapeutic  activity, Neuromuscular re-education, Balance training, Gait training, Patient/Family education, Self Care, Joint mobilization, Aquatic Therapy, Dry Needling, Cryotherapy, Moist heat, Manual therapy, and Re-evaluation   PLAN FOR NEXT SESSION: Re-eval: progress single leg stability.  review and progress HEP prn; quad/hamstring strengthening, glute strengthening, progress CKC strengthening as tolerated (wants to line dance without Pain).     Hessie Diener, PTA 04/17/22 12:35 PM Phone: [phone] Fax: [phone]

## 2022-04-23 NOTE — Therapy (Signed)
OUTPATIENT PHYSICAL THERAPY TREATMENT NOTE/RE-CERTIFICATION Progress Note Reporting Period 03/11/22 to 04/24/22  See note below for Objective Data and Assessment of Progress/Goals.      Patient Name: Sydney Tran MRN: [id number] DOB:Sep 09, 1955, 67 y.o., female Today's Date: 04/24/2022  PCP: Seward Carol, MD   REFERRING PROVIDER: Pedro Earls, MD  END OF SESSION:   PT End of Session - 04/24/22 1100     Visit Number 6    Number of Visits 12    Date for PT Re-Evaluation 06/08/22    Authorization Type MCR    Progress Note Due on Visit 16    PT Start Time 1101    PT Stop Time 1144    PT Time Calculation (min) 43 min    Activity Tolerance Patient tolerated treatment well    Behavior During Therapy WFL for tasks assessed/performed               Past Medical History:  Diagnosis Date   Allergy    SEASONAL   Asthma    GERD (gastroesophageal reflux disease)    Hypertension    Sleep apnea    Past Surgical History:  Procedure Laterality Date   TONSILLECTOMY     TUBAL LIGATION     Patient Active Problem List   Diagnosis Date Noted   Dyspnea on exertion 04/14/2016   Upper airway cough syndrome 03/15/2016    REFERRING DIAG: Rt knee OA   THERAPY DIAG:  Chronic pain of right knee  Muscle weakness (generalized)  Difficulty in walking, not elsewhere classified  Rationale for Evaluation and Treatment Rehabilitation  PERTINENT HISTORY: Asthma  Hypertension  PRECAUTIONS: None   WEIGHT BEARING RESTRICTIONS: No  SUBJECTIVE:                                                                                                                                                                                      SUBJECTIVE STATEMENT: "I was feeling better, but I woke up yesterday and could feel the pain again." She reports the pain is coming and going. She still feels like she needs some work on stretching and her knee won't let her line dance.     PAIN:  Are you  having pain? None currently Yes: NPRS scale: 3 at worst/10 Pain location: Rt anteromedial knee- also superior and posterior knee.  Pain description: "twinge" Aggravating factors: dance Relieving factors: exercise, water aerobics    OBJECTIVE: (objective measures completed at initial evaluation unless otherwise dated)    DIAGNOSTIC FINDINGS: Rt knee X-ray: FINDINGS: No evidence of fracture, dislocation, or joint effusion. There is tricompartmental joint space narrowing, sclerosis and osteophytes consistent with degenerative joint disease. No  evidence of effusion.   IMPRESSION: Degenerative changes.   PATIENT SURVEYS:  FOTO 61% function to 67% predicted   04/10/22: 67%  COGNITION: Overall cognitive status: Within functional limits for tasks assessed                         SENSATION: Not tested   EDEMA:  No obvious swelling about the knee    MUSCLE LENGTH: Hamstrings: mild tightness bilaterally    POSTURE: No Significant postural limitations   PALPATION: No palpable tenderness about Rt knee    LOWER EXTREMITY ROM:   Active ROM Right eval Left eval Right  04/03/22  Hip flexion       Hip extension       Hip abduction       Hip adduction       Hip internal rotation       Hip external rotation       Knee flexion 114 123 130  Knee extension       Ankle dorsiflexion       Ankle plantarflexion       Ankle inversion       Ankle eversion        (Blank rows = not tested)   LOWER EXTREMITY MMT:   MMT Right eval Left eval Right 04/10/22 04/24/22 Right   Hip flexion 4- 4 4+ 4  Hip extension 4 4  4/5 bilateral   Hip abduction 4 4+  Rt: 4; Lt: 4+   Hip adduction        Hip internal rotation        Hip external rotation        Knee flexion 4- 5 5 5   Knee extension 4 5 4+ 5  Ankle dorsiflexion        Ankle plantarflexion        Ankle inversion        Ankle eversion         (Blank rows = not tested)   LOWER EXTREMITY SPECIAL TESTS:  (+) Ely (-) valgus, varus,  anterior drawer, posterior drawer, McMurray's    FUNCTIONIAL TESTS:  5 x STS: 16.6 seconds knee pain ; 04/03/22: 9.8 sec  6 MWT: 1124 ft pain after 4.5 minutes 04/10/22: Left SLS 37 sec, Right 45 sec 04/24/22: SLS on airex RLE 8 seconds "pulling" sensation Rt knee; LLE 11 seconds    GAIT: Distance walked: 6MWT Assistive device utilized: None Level of assistance: Complete Independence Comments: Trendelenburg Rt    [facility] Adult PT Treatment:                                                DATE: 04/24/22 Therapeutic Exercise: Recumbent bike level 3 x 5 minutes  3 way hip 1 x 10 each  Leg press SL 2 x 10 @ 20 lbs   Neuromuscular re-ed: Tandem airex balance beam walks 4 sets d/b fwd/bwd  Therapeutic Activity: Re-assessment to determine overall progress, educating patient on progress towards goals    [facility] Adult PT Treatment:                                                DATE: 04/17/22 Therapeutic Exercise:  Nustep L5 x 5 min Tandem stance on AIREX  Alternating step up onto AIREX pad  SLS on AIREX  15 sec right Wall squat x 8 , 5 sec each  LAQ x 10, added red band 10 x 2  SLR x 10 each  SLR with ER x 10 each  Right hip flexor stretch 3 x 30 sec  Right hamstring stretch 3 x 30 sec, supine with strap    [facility] Adult PT Treatment:                                                DATE: 04/10/22 Therapeutic Exercise: Rec bike L1 x 5 minutes  STS from bariatric x 10  LAQ 10 x 2 Right-palpable medial knee dysfunction 4 inch step up x 10 4 inch lateral step down x 10 4 inch retro step up x 10 SLS 37 sec left, 45 sec right SLR 5 x 2 each SLR with ER 5 x 2 each- fatigues quickly on right.       PATIENT EDUCATION:  Education details: see treatment  Person educated: Patient Education method: Explanation Education comprehension: verbalized understanding   HOME EXERCISE PROGRAM: Access Code: [email or web address] URL: [email or web address] Date: 03/11/2022 Prepared by: Gwendolyn Grant    Exercises - Modified Thomas Stretch  - 1 x daily - 7 x weekly - 3 sets - 30 sec hold - Seated Long Arc Quad  - 1 x daily - 7 x weekly - 2 sets - 10 reps - Hooklying Clamshell with Resistance  - 1 x daily - 7 x weekly - 2 sets - 10 reps Added 03/22/22 Banded bridge 10 reps, 2 sets  04/03/22 - Sit to stand with control  - 1 x daily - 7 x weekly - 1-2 sets - 10 reps Added 04/10/22 -- Active straight leg raise  - 1 x daily - 7 x weekly - 1-2 sets - 5-10 reps - Straight Leg Raise with External Rotation  - 1 x daily - 7 x weekly - 1-2 sets - 5-10 reps 04/17/22 - Single Leg Stance on Foam Pad  - 1 x daily - 7 x weekly - 1 sets - 3 reps - 30 hold   ASSESSMENT:   CLINICAL IMPRESSION: Patient is progressing well in PT reporting an overall improvement in her Rt knee pain. She demonstrates improvements in Rt knee strength and ROM since the start of care and reports improved tolerance to walking activity. She has met all short term functional goals and the majority of initial long term functional goals. New functional goals were established today to assist in achieving patient's desire to be able to line dance as she is currently unable to do secondary to her Rt knee pain. She will benefit from continued skilled PT to further progress her balance and closed chain strengthening in order to optimize her function and improve ability to participate in recreational activity.      OBJECTIVE IMPAIRMENTS: Abnormal gait, decreased activity tolerance, decreased knowledge of condition, difficulty walking, decreased ROM, decreased strength, improper body mechanics, and pain.    ACTIVITY LIMITATIONS: carrying, lifting, bending, standing, sleeping, and locomotion level   PARTICIPATION LIMITATIONS: meal prep, cleaning, laundry, shopping, and community activity   PERSONAL FACTORS: Age, Fitness, Time since onset of injury/illness/exacerbation, and 1-2 comorbidities: see PMH above  are also affecting patient's functional  outcome.  REHAB POTENTIAL: Good   CLINICAL DECISION MAKING: Stable/uncomplicated   EVALUATION COMPLEXITY: Low     GOALS: Goals reviewed with patient? Yes   SHORT TERM GOALS: Target date: 04/01/2022     Patient will be independent and compliant with initial HEP.    Baseline: issued at eval Goal status: MET   2.  Patient will demonstrate at least 120 degrees of Rt knee flexion AROM to improve ease of sit to stand transfers.  Baseline: see above 04/03/22: 130 Goal status: MET   3.  Patient will complete 5 x STS in </= 12 seconds to improve functional strength.  Baseline: see above  04/03/22: 9.8 sec no pain , no UE assist Goal status: MET      LONG TERM GOALS: Target date: 06/08/2022         Patient will score at least 67% on FOTO to signify clinically meaningful improvement in functional abilities.    Baseline: see above  04/10/22: 67% Goal status: MET   2.  Patient will walk at least 1500 ft without onset of knee pain during 6 MWT.  Baseline: see above 04/10/22: walking 2 miles, no pain  Goal status: MET   3.  Patient will demonstrate 5/5 Rt knee strength to improve gait stability.  Baseline: see above  04/10/22: 5/5 right knee  Goal status: MET    4.  Patient will report pain at worst rated as </= 3/10 to reduce her current functional limitations.  Baseline: see above  04/10/22: 3/10 at most Goal status: MET    5.  Patient will be independent with advanced home program to assist in management of her chronic condition.  Baseline: initial HEP issued  Goal status: ONGOING  6.  Patient will demonstrate 5/5 Rt hip strength to improve stability with prolonged standing activity.  Baseline: see above Goal status: NEW  7.  Patient will maintain SLS on airex for at least 15 seconds without onset of knee pain to improve stability with dancing activity.  Baseline: see above  Goal status: NEW      PLAN:   PT FREQUENCY: 1/week    PT DURATION: 6 weeks   PLANNED  INTERVENTIONS: Therapeutic exercises, Therapeutic activity, Neuromuscular re-education, Balance training, Gait training, Patient/Family education, Self Care, Joint mobilization, Aquatic Therapy, Dry Needling, Cryotherapy, Moist heat, Manual therapy, and Re-evaluation   PLAN FOR NEXT SESSION: progress single leg stability and CKC strengthening as tolerated.    Gwendolyn Grant, PT, DPT, ATC 04/24/22 11:54 AM

## 2022-04-24 ENCOUNTER — Ambulatory Visit: Payer: Medicare Other

## 2022-04-24 DIAGNOSIS — G8929 Other chronic pain: Secondary | ICD-10-CM

## 2022-04-24 DIAGNOSIS — R262 Difficulty in walking, not elsewhere classified: Secondary | ICD-10-CM | POA: Diagnosis not present

## 2022-04-24 DIAGNOSIS — M25561 Pain in right knee: Secondary | ICD-10-CM | POA: Diagnosis not present

## 2022-04-24 DIAGNOSIS — M6281 Muscle weakness (generalized): Secondary | ICD-10-CM | POA: Diagnosis not present

## 2022-05-01 ENCOUNTER — Encounter: Payer: Self-pay | Admitting: Physical Therapy

## 2022-05-01 ENCOUNTER — Ambulatory Visit: Payer: Medicare Other | Attending: Internal Medicine | Admitting: Physical Therapy

## 2022-05-01 DIAGNOSIS — G8929 Other chronic pain: Secondary | ICD-10-CM | POA: Diagnosis not present

## 2022-05-01 DIAGNOSIS — M25561 Pain in right knee: Secondary | ICD-10-CM | POA: Diagnosis not present

## 2022-05-01 DIAGNOSIS — M6281 Muscle weakness (generalized): Secondary | ICD-10-CM

## 2022-05-01 DIAGNOSIS — R262 Difficulty in walking, not elsewhere classified: Secondary | ICD-10-CM | POA: Diagnosis not present

## 2022-05-01 NOTE — Therapy (Signed)
OUTPATIENT PHYSICAL THERAPY TREATMENT NOTE      Patient Name: Sydney Tran MRN: [id number] DOB:[birth year], 67 y.o., female Today's Date: 05/01/2022  PCP: Seward Carol, MD   REFERRING PROVIDER: Pedro Earls, MD  END OF SESSION:   PT End of Session - 05/01/22 1107     Visit Number 7    Number of Visits 12    Date for PT Re-Evaluation 06/08/22    Authorization Type MCR    PT Start Time 1107    PT Stop Time 1145    PT Time Calculation (min) 38 min               Past Medical History:  Diagnosis Date   Allergy    SEASONAL   Asthma    GERD (gastroesophageal reflux disease)    Hypertension    Sleep apnea    Past Surgical History:  Procedure Laterality Date   TONSILLECTOMY     TUBAL LIGATION     Patient Active Problem List   Diagnosis Date Noted   Dyspnea on exertion 04/14/2016   Upper airway cough syndrome 03/15/2016    REFERRING DIAG: Rt knee OA   THERAPY DIAG:  Chronic pain of right knee  Muscle weakness (generalized)  Difficulty in walking, not elsewhere classified  Rationale for Evaluation and Treatment Rehabilitation  PERTINENT HISTORY: Asthma  Hypertension  PRECAUTIONS: None   WEIGHT BEARING RESTRICTIONS: No  SUBJECTIVE:                                                                                                                                                                                      SUBJECTIVE STATEMENT: "Doing pretty good. No knee pain now. Nothing new"  PAIN:  Are you having pain? None currently Yes: NPRS scale: 0/10 Pain location: Rt anteromedial knee- also superior and posterior knee.  Pain description: "twinge" Aggravating factors: dance Relieving factors: exercise, water aerobics    OBJECTIVE: (objective measures completed at initial evaluation unless otherwise dated)    DIAGNOSTIC FINDINGS: Rt knee X-ray: FINDINGS: No evidence of fracture, dislocation, or joint effusion. There is tricompartmental joint  space narrowing, sclerosis and osteophytes consistent with degenerative joint disease. No evidence of effusion.   IMPRESSION: Degenerative changes.   PATIENT SURVEYS:  FOTO 61% function to 67% predicted   04/10/22: 67%  COGNITION: Overall cognitive status: Within functional limits for tasks assessed                         SENSATION: Not tested   EDEMA:  No obvious swelling about the knee    MUSCLE LENGTH: Hamstrings: mild tightness bilaterally  POSTURE: No Significant postural limitations   PALPATION: No palpable tenderness about Rt knee    LOWER EXTREMITY ROM:   Active ROM Right eval Left eval Right  04/03/22  Hip flexion       Hip extension       Hip abduction       Hip adduction       Hip internal rotation       Hip external rotation       Knee flexion 114 123 130  Knee extension       Ankle dorsiflexion       Ankle plantarflexion       Ankle inversion       Ankle eversion        (Blank rows = not tested)   LOWER EXTREMITY MMT:   MMT Right eval Left eval Right 04/10/22 04/24/22 Right   Hip flexion 4- 4 4+ 4  Hip extension 4 4  4/5 bilateral   Hip abduction 4 4+  Rt: 4; Lt: 4+   Hip adduction        Hip internal rotation        Hip external rotation        Knee flexion 4- 5 5 5   Knee extension 4 5 4+ 5  Ankle dorsiflexion        Ankle plantarflexion        Ankle inversion        Ankle eversion         (Blank rows = not tested)   LOWER EXTREMITY SPECIAL TESTS:  (+) [place] (-) valgus, varus, anterior drawer, posterior drawer, McMurray's    FUNCTIONIAL TESTS:  5 x STS: 16.6 seconds knee pain ; 04/03/22: 9.8 sec  6 MWT: 1124 ft pain after 4.5 minutes 04/10/22: Left SLS 37 sec, Right 45 sec 04/24/22: SLS on airex RLE 8 seconds "pulling" sensation Rt knee; LLE 11 seconds    GAIT: Distance walked: 6MWT Assistive device utilized: None Level of assistance: Complete Independence Comments: Trendelenburg Rt    [facility] Adult PT Treatment:                                                 DATE: 05/01/22 Therapeutic Exercise: Rec Bike L2 x 5 minutes Single leg bridge 10 x 2 each SLR with ER 10 x 2 right  6 inch step up with opp knee drive, 1 set with 1 UE support, 1 set without UE support 6 Inch lateral step up x 10 - light UE support  SL leg press 20# 10 x 1 each   Neuromuscular re-ed: Tandem airex balance beam walks 4 sets d/b fwd/bwd  [place] on Tandem Airex Tandem airex with ball toss Side stepping on Tandem airex  SLS on airex 11 sec best bilat    [facility] Adult PT Treatment:                                                DATE: 04/24/22 Therapeutic Exercise: Recumbent bike level 3 x 5 minutes  3 way hip 1 x 10 each  Leg press SL 2 x 10 @ 20 lbs   Neuromuscular re-ed: Tandem airex balance beam walks 4 sets d/b fwd/bwd  Therapeutic Activity: Re-assessment to determine overall progress, educating patient on progress towards goals    [facility] Adult PT Treatment:                                                DATE: 04/17/22 Therapeutic Exercise: Nustep L5 x 5 min Tandem stance on AIREX  Alternating step up onto AIREX pad  SLS on AIREX  15 sec right Wall squat x 8 , 5 sec each  LAQ x 10, added red band 10 x 2  SLR x 10 each  SLR with ER x 10 each  Right hip flexor stretch 3 x 30 sec  Right hamstring stretch 3 x 30 sec, supine with strap    [facility] Adult PT Treatment:                                                DATE: 04/10/22 Therapeutic Exercise: Rec bike L1 x 5 minutes  STS from bariatric x 10  LAQ 10 x 2 Right-palpable medial knee dysfunction 4 inch step up x 10 4 inch lateral step down x 10 4 inch retro step up x 10 SLS 37 sec left, 45 sec right SLR 5 x 2 each SLR with ER 5 x 2 each- fatigues quickly on right.       PATIENT EDUCATION:  Education details: see treatment  Person educated: Patient Education method: Explanation Education comprehension: verbalized understanding   HOME EXERCISE PROGRAM: Access Code:  [email or web address] URL: [email or web address] Date: 03/11/2022 Prepared by: Gwendolyn Grant   Exercises - Modified Thomas Stretch  - 1 x daily - 7 x weekly - 3 sets - 30 sec hold - Seated Long Arc Quad  - 1 x daily - 7 x weekly - 2 sets - 10 reps - Hooklying Clamshell with Resistance  - 1 x daily - 7 x weekly - 2 sets - 10 reps Added 03/22/22 Banded bridge 10 reps, 2 sets  04/03/22 - Sit to stand with control  - 1 x daily - 7 x weekly - 1-2 sets - 10 reps Added 04/10/22 -- Active straight leg raise  - 1 x daily - 7 x weekly - 1-2 sets - 5-10 reps - Straight Leg Raise with External Rotation  - 1 x daily - 7 x weekly - 1-2 sets - 5-10 reps 04/17/22 - Single Leg Stance on Foam Pad  - 1 x daily - 7 x weekly - 1 sets - 3 reps - 30 hold   ASSESSMENT:   CLINICAL IMPRESSION: Patient arrives without knee pain. Continued with closed chain knee stability, noting improvement in single leg stance on unlevel surface. She does have decreased motor control right compared to left. Continued with hip strengthening and single leg leg press which she tolerated well. No complaints of pain today and overall good tolerance to prescribed therex. She will benefit from continued skilled PT to further progress her balance and closed chain strengthening in order to optimize her function and improve ability to participate in recreational activity.      OBJECTIVE IMPAIRMENTS: Abnormal gait, decreased activity tolerance, decreased knowledge of condition, difficulty walking, decreased ROM, decreased strength, improper body mechanics, and pain.    ACTIVITY LIMITATIONS: carrying, lifting, bending, standing,  sleeping, and locomotion level   PARTICIPATION LIMITATIONS: meal prep, cleaning, laundry, shopping, and community activity   PERSONAL FACTORS: Age, Fitness, Time since onset of injury/illness/exacerbation, and 1-2 comorbidities: see PMH above  are also affecting patient's functional outcome.    REHAB POTENTIAL:  Good   CLINICAL DECISION MAKING: Stable/uncomplicated   EVALUATION COMPLEXITY: Low     GOALS: Goals reviewed with patient? Yes   SHORT TERM GOALS: Target date: 04/01/2022     Patient will be independent and compliant with initial HEP.    Baseline: issued at eval Goal status: MET   2.  Patient will demonstrate at least 120 degrees of Rt knee flexion AROM to improve ease of sit to stand transfers.  Baseline: see above 04/03/22: 130 Goal status: MET   3.  Patient will complete 5 x STS in </= 12 seconds to improve functional strength.  Baseline: see above  04/03/22: 9.8 sec no pain , no UE assist Goal status: MET      LONG TERM GOALS: Target date: 06/08/2022         Patient will score at least 67% on FOTO to signify clinically meaningful improvement in functional abilities.    Baseline: see above  04/10/22: 67% Goal status: MET   2.  Patient will walk at least 1500 ft without onset of knee pain during 6 MWT.  Baseline: see above 04/10/22: walking 2 miles, no pain  Goal status: MET   3.  Patient will demonstrate 5/5 Rt knee strength to improve gait stability.  Baseline: see above  04/10/22: 5/5 right knee  Goal status: MET    4.  Patient will report pain at worst rated as </= 3/10 to reduce her current functional limitations.  Baseline: see above  04/10/22: 3/10 at most Goal status: MET    5.  Patient will be independent with advanced home program to assist in management of her chronic condition.  Baseline: initial HEP issued  Goal status: ONGOING  6.  Patient will demonstrate 5/5 Rt hip strength to improve stability with prolonged standing activity.  Baseline: see above Goal status: NEW  7.  Patient will maintain SLS on airex for at least 15 seconds without onset of knee pain to improve stability with dancing activity.  Baseline: see above  Goal status: NEW      PLAN:   PT FREQUENCY: 1/week    PT DURATION: 6 weeks   PLANNED INTERVENTIONS: Therapeutic  exercises, Therapeutic activity, Neuromuscular re-education, Balance training, Gait training, Patient/Family education, Self Care, Joint mobilization, Aquatic Therapy, Dry Needling, Cryotherapy, Moist heat, Manual therapy, and Re-evaluation   PLAN FOR NEXT SESSION: progress single leg stability and CKC strengthening as tolerated.    Hessie Diener, PTA 05/01/22 11:42 AM Phone: [phone] Fax: [phone]

## 2022-05-15 ENCOUNTER — Ambulatory Visit: Payer: Medicare Other

## 2022-05-15 DIAGNOSIS — G8929 Other chronic pain: Secondary | ICD-10-CM

## 2022-05-15 DIAGNOSIS — M6281 Muscle weakness (generalized): Secondary | ICD-10-CM | POA: Diagnosis not present

## 2022-05-15 DIAGNOSIS — M25561 Pain in right knee: Secondary | ICD-10-CM | POA: Diagnosis not present

## 2022-05-15 DIAGNOSIS — R262 Difficulty in walking, not elsewhere classified: Secondary | ICD-10-CM | POA: Diagnosis not present

## 2022-05-15 NOTE — Therapy (Signed)
OUTPATIENT PHYSICAL THERAPY TREATMENT NOTE      Patient Name: Sydney Tran MRN: [id number] DOB:07-21-1955, 67 y.o., female Today's Date: 05/15/2022  PCP: Renford Dills, MD   REFERRING PROVIDER: Delfin Gant, MD  END OF SESSION:   PT End of Session - 05/15/22 1151     Visit Number 8    Number of Visits 12    Date for PT Re-Evaluation 06/08/22    Authorization Type MCR    Progress Note Due on Visit 16    PT Start Time 1150    PT Stop Time 1229    PT Time Calculation (min) 39 min    Activity Tolerance Patient tolerated treatment well    Behavior During Therapy WFL for tasks assessed/performed                Past Medical History:  Diagnosis Date   Allergy    SEASONAL   Asthma    GERD (gastroesophageal reflux disease)    Hypertension    Sleep apnea    Past Surgical History:  Procedure Laterality Date   TONSILLECTOMY     TUBAL LIGATION     Patient Active Problem List   Diagnosis Date Noted   Dyspnea on exertion 04/14/2016   Upper airway cough syndrome 03/15/2016    REFERRING DIAG: Rt knee OA   THERAPY DIAG:  Chronic pain of right knee  Muscle weakness (generalized)  Difficulty in walking, not elsewhere classified  Rationale for Evaluation and Treatment Rehabilitation  PERTINENT HISTORY: Asthma  Hypertension  PRECAUTIONS: None   WEIGHT BEARING RESTRICTIONS: No  SUBJECTIVE:                                                                                                                                                                                      SUBJECTIVE STATEMENT: "Pretty good. My mom is in the hospital, so I haven't been doing too much."  PAIN:  Are you having pain? None currently NPRS scale: 3 at worst/10 Pain location: Rt anteromedial knee- also superior and posterior knee.  Pain description: "twinge" Aggravating factors: dance Relieving factors: exercise, water aerobics    OBJECTIVE: (objective measures completed at  initial evaluation unless otherwise dated)    DIAGNOSTIC FINDINGS: Rt knee X-ray: FINDINGS: No evidence of fracture, dislocation, or joint effusion. There is tricompartmental joint space narrowing, sclerosis and osteophytes consistent with degenerative joint disease. No evidence of effusion.   IMPRESSION: Degenerative changes.   PATIENT SURVEYS:  FOTO 61% function to 67% predicted   04/10/22: 67%  COGNITION: Overall cognitive status: Within functional limits for tasks assessed  SENSATION: Not tested   EDEMA:  No obvious swelling about the knee    MUSCLE LENGTH: Hamstrings: mild tightness bilaterally    POSTURE: No Significant postural limitations   PALPATION: No palpable tenderness about Rt knee    LOWER EXTREMITY ROM:   Active ROM Right eval Left eval Right  04/03/22  Hip flexion       Hip extension       Hip abduction       Hip adduction       Hip internal rotation       Hip external rotation       Knee flexion 114 123 130  Knee extension       Ankle dorsiflexion       Ankle plantarflexion       Ankle inversion       Ankle eversion        (Blank rows = not tested)   LOWER EXTREMITY MMT:   MMT Right eval Left eval Right 04/10/22 04/24/22 Right   Hip flexion 4- 4 4+ 4  Hip extension 4 4  4/5 bilateral   Hip abduction 4 4+  Rt: 4; Lt: 4+   Hip adduction        Hip internal rotation        Hip external rotation        Knee flexion 4- 5 5 5   Knee extension 4 5 4+ 5  Ankle dorsiflexion        Ankle plantarflexion        Ankle inversion        Ankle eversion         (Blank rows = not tested)   LOWER EXTREMITY SPECIAL TESTS:  (+) [place] (-) valgus, varus, anterior drawer, posterior drawer, McMurray's    FUNCTIONIAL TESTS:  5 x STS: 16.6 seconds knee pain ; 04/03/22: 9.8 sec  6 MWT: 1124 ft pain after 4.5 minutes 04/10/22: Left SLS 37 sec, Right 45 sec 04/24/22: SLS on airex RLE 8 seconds "pulling" sensation Rt knee; LLE 11 seconds     GAIT: Distance walked: Assistive device utilized: None Level of assistance: Complete Independence Comments: Trendelenburg Rt   [facility] Adult PT Treatment:                                                DATE: 05/15/22 Therapeutic Exercise: Recumbent bike level 3 x 5 minutes  Step ups 6 inch x 10 each, step up knee driver x 10 each  Lateral step up 6 inch 2 x 10   Neuromuscular re-ed: Marching on airex 2 x 10  SL ball toss 2 x 10  HEP verbal update to include SL tennis ball toss    [facility] Adult PT Treatment:                                                DATE: 05/01/22 Therapeutic Exercise: Rec Bike L2 x 5 minutes Single leg bridge 10 x 2 each SLR with ER 10 x 2 right  6 inch step up with opp knee drive, 1 set with 1 UE support, 1 set without UE support 6 Inch lateral step up x 10 - light UE support  SL  leg press 20# 10 x 1 each   Neuromuscular re-ed: Tandem airex balance beam walks 4 sets d/b fwd/bwd  [place] on Tandem Airex Tandem airex with ball toss Side stepping on Tandem airex  SLS on airex 11 sec best bilat    [facility] Adult PT Treatment:                                                DATE: 04/24/22 Therapeutic Exercise: Recumbent bike level 3 x 5 minutes  3 way hip 1 x 10 each  Leg press SL 2 x 10 @ 20 lbs   Neuromuscular re-ed: Tandem airex balance beam walks 4 sets d/b fwd/bwd  Therapeutic Activity: Re-assessment to determine overall progress, educating patient on progress towards goals       PATIENT EDUCATION:  Education details: see treatment  Person educated: Patient Education method: Explanation, demo Education comprehension: verbalized understanding   HOME EXERCISE PROGRAM: Access Code: [email or web address] URL: [email or web address] Date: 03/11/2022 Prepared by: Letitia Libra   Exercises - Modified Thomas Stretch  - 1 x daily - 7 x weekly - 3 sets - 30 sec hold - Seated Long Arc Quad  - 1 x daily - 7 x weekly - 2 sets - 10 reps -  Hooklying Clamshell with Resistance  - 1 x daily - 7 x weekly - 2 sets - 10 reps Added 03/22/22 Banded bridge 10 reps, 2 sets  04/03/22 - Sit to stand with control  - 1 x daily - 7 x weekly - 1-2 sets - 10 reps Added 04/10/22 -- Active straight leg raise  - 1 x daily - 7 x weekly - 1-2 sets - 5-10 reps - Straight Leg Raise with External Rotation  - 1 x daily - 7 x weekly - 1-2 sets - 5-10 reps 04/17/22 - Single Leg Stance on Foam Pad  - 1 x daily - 7 x weekly - 1 sets - 3 reps - 30 hold   ASSESSMENT:   CLINICAL IMPRESSION: Patient arrives without knee pain, but hasn't been able to complete her HEP much this week due to a family member being hospitalized. Focused on progression of closed chain strengthening and balance activity. She has fair postural control during SL dynamic balance activity requiring occasional use of reaching/stepping strategy to maintain her balance more so on the RLE vs. LLE. Occasional pelvic drop noted during SL activity on the RLE. Verbal update to HEP to include dynamic SL balance.    OBJECTIVE IMPAIRMENTS: Abnormal gait, decreased activity tolerance, decreased knowledge of condition, difficulty walking, decreased ROM, decreased strength, improper body mechanics, and pain.    ACTIVITY LIMITATIONS: carrying, lifting, bending, standing, sleeping, and locomotion level   PARTICIPATION LIMITATIONS: meal prep, cleaning, laundry, shopping, and community activity   PERSONAL FACTORS: Age, Fitness, Time since onset of injury/illness/exacerbation, and 1-2 comorbidities: see PMH above  are also affecting patient's functional outcome.    REHAB POTENTIAL: Good   CLINICAL DECISION MAKING: Stable/uncomplicated   EVALUATION COMPLEXITY: Low     GOALS: Goals reviewed with patient? Yes   SHORT TERM GOALS: Target date: 04/01/2022     Patient will be independent and compliant with initial HEP.    Baseline: issued at eval Goal status: MET   2.  Patient will demonstrate at least  120 degrees of Rt knee flexion AROM to improve ease of sit to  stand transfers.  Baseline: see above 04/03/22: 130 Goal status: MET   3.  Patient will complete 5 x STS in </= 12 seconds to improve functional strength.  Baseline: see above  04/03/22: 9.8 sec no pain , no UE assist Goal status: MET      LONG TERM GOALS: Target date: 06/08/2022         Patient will score at least 67% on FOTO to signify clinically meaningful improvement in functional abilities.    Baseline: see above  04/10/22: 67% Goal status: MET   2.  Patient will walk at least 1500 ft without onset of knee pain during 6 MWT.  Baseline: see above 04/10/22: walking 2 miles, no pain  Goal status: MET   3.  Patient will demonstrate 5/5 Rt knee strength to improve gait stability.  Baseline: see above  04/10/22: 5/5 right knee  Goal status: MET    4.  Patient will report pain at worst rated as </= 3/10 to reduce her current functional limitations.  Baseline: see above  04/10/22: 3/10 at most Goal status: MET    5.  Patient will be independent with advanced home program to assist in management of her chronic condition.  Baseline: initial HEP issued  Goal status: ONGOING  6.  Patient will demonstrate 5/5 Rt hip strength to improve stability with prolonged standing activity.  Baseline: see above Goal status: NEW  7.  Patient will maintain SLS on airex for at least 15 seconds without onset of knee pain to improve stability with dancing activity.  Baseline: see above  Goal status: NEW      PLAN:   PT FREQUENCY: 1/week    PT DURATION: 6 weeks   PLANNED INTERVENTIONS: Therapeutic exercises, Therapeutic activity, Neuromuscular re-education, Balance training, Gait training, Patient/Family education, Self Care, Joint mobilization, Aquatic Therapy, Dry Needling, Cryotherapy, Moist heat, Manual therapy, and Re-evaluation   PLAN FOR NEXT SESSION: progress single leg stability and CKC strengthening as tolerated.     Letitia Libra, PT, DPT, ATC 05/15/22 12:31 PM

## 2022-05-22 ENCOUNTER — Ambulatory Visit: Payer: Medicare Other

## 2022-05-22 DIAGNOSIS — M25561 Pain in right knee: Secondary | ICD-10-CM | POA: Diagnosis not present

## 2022-05-22 DIAGNOSIS — R262 Difficulty in walking, not elsewhere classified: Secondary | ICD-10-CM | POA: Diagnosis not present

## 2022-05-22 DIAGNOSIS — M6281 Muscle weakness (generalized): Secondary | ICD-10-CM | POA: Diagnosis not present

## 2022-05-22 DIAGNOSIS — G8929 Other chronic pain: Secondary | ICD-10-CM | POA: Diagnosis not present

## 2022-05-22 NOTE — Therapy (Signed)
OUTPATIENT PHYSICAL THERAPY TREATMENT NOTE      Patient Name: Sydney Tran MRN: [id number] DOB:09-Nov-1955, 67 y.o., female Today's Date: 05/22/2022  PCP: Renford Dills, MD   REFERRING PROVIDER: Delfin Gant, MD  END OF SESSION:   PT End of Session - 05/22/22 1112     Visit Number 9    Number of Visits 12    Date for PT Re-Evaluation 06/08/22    Authorization Type MCR    Progress Note Due on Visit 16    PT Start Time 1112   patient late   PT Stop Time 1144    PT Time Calculation (min) 32 min    Activity Tolerance Patient tolerated treatment well    Behavior During Therapy WFL for tasks assessed/performed                 Past Medical History:  Diagnosis Date   Allergy    SEASONAL   Asthma    GERD (gastroesophageal reflux disease)    Hypertension    Sleep apnea    Past Surgical History:  Procedure Laterality Date   TONSILLECTOMY     TUBAL LIGATION     Patient Active Problem List   Diagnosis Date Noted   Dyspnea on exertion 04/14/2016   Upper airway cough syndrome 03/15/2016    REFERRING DIAG: Rt knee OA   THERAPY DIAG:  Chronic pain of right knee  Muscle weakness (generalized)  Difficulty in walking, not elsewhere classified  Rationale for Evaluation and Treatment Rehabilitation  PERTINENT HISTORY: Asthma  Hypertension  PRECAUTIONS: None   WEIGHT BEARING RESTRICTIONS: No  SUBJECTIVE:                                                                                                                                                                                      SUBJECTIVE STATEMENT: "I'm doing good.The knee is fine."   PAIN:  Are you having pain? none currently NPRS scale: 2 at worst/10 Pain location: Rt knee Pain description: "twinge" Aggravating factors: dance Relieving factors: exercise, water aerobics    OBJECTIVE: (objective measures completed at initial evaluation unless otherwise dated)    DIAGNOSTIC FINDINGS: Rt  knee X-ray: FINDINGS: No evidence of fracture, dislocation, or joint effusion. There is tricompartmental joint space narrowing, sclerosis and osteophytes consistent with degenerative joint disease. No evidence of effusion.   IMPRESSION: Degenerative changes.   PATIENT SURVEYS:  FOTO 61% function to 67% predicted   04/10/22: 67%  COGNITION: Overall cognitive status: Within functional limits for tasks assessed  SENSATION: Not tested   EDEMA:  No obvious swelling about the knee    MUSCLE LENGTH: Hamstrings: mild tightness bilaterally    POSTURE: No Significant postural limitations   PALPATION: No palpable tenderness about Rt knee    LOWER EXTREMITY ROM:   Active ROM Right eval Left eval Right  04/03/22  Hip flexion       Hip extension       Hip abduction       Hip adduction       Hip internal rotation       Hip external rotation       Knee flexion 114 123 130  Knee extension       Ankle dorsiflexion       Ankle plantarflexion       Ankle inversion       Ankle eversion        (Blank rows = not tested)   LOWER EXTREMITY MMT:   MMT Right eval Left eval Right 04/10/22 04/24/22 Right   Hip flexion 4- 4 4+ 4  Hip extension 4 4  4/5 bilateral   Hip abduction 4 4+  Rt: 4; Lt: 4+   Hip adduction        Hip internal rotation        Hip external rotation        Knee flexion 4- Knee extension 4 5 4+ 5  Ankle dorsiflexion        Ankle plantarflexion        Ankle inversion        Ankle eversion         (Blank rows = not tested)   LOWER EXTREMITY SPECIAL TESTS:  (+) [place] (-) valgus, varus, anterior drawer, posterior drawer, McMurray's    FUNCTIONIAL TESTS:  5 x STS: 16.6 seconds knee pain ; 04/03/22: 9.8 sec  6 MWT: 1124 ft pain after 4.5 minutes 04/10/22: Left SLS 37 sec, Right 45 sec 04/24/22: SLS on airex RLE 8 seconds "pulling" sensation Rt knee; LLE 11 seconds    GAIT: Distance walked: Assistive device utilized: None Level  of assistance: Complete Independence Comments: Trendelenburg Rt  [facility] Adult PT Treatment:                                                DATE: 05/22/22 Therapeutic Exercise: Recumbent bike level 3 x 5 minutes  3 way hip on airex 2 x 10   Neuromuscular re-ed: SL cone tap 6 tall cones; 4 sets each  SL opposite touchdown; 6 tall cones x 10 each    [facility] Adult PT Treatment:                                                DATE: 05/15/22 Therapeutic Exercise: Recumbent bike level 3 x 5 minutes  Step ups 6 inch x 10 each, step up knee driver x 10 each  Lateral step up 6 inch 2 x 10   Neuromuscular re-ed: Marching on airex 2 x 10  SL ball toss 2 x 10  HEP verbal update to include SL tennis ball toss    [facility] Adult PT Treatment:  DATE: 05/01/22 Therapeutic Exercise: Rec Bike L2 x 5 minutes Single leg bridge 10 x 2 each SLR with ER 10 x 2 right  6 inch step up with opp knee drive, 1 set with 1 UE support, 1 set without UE support 6 Inch lateral step up x 10 - light UE support  SL leg press 20# 10 x 1 each   Neuromuscular re-ed: Tandem airex balance beam walks 4 sets d/b fwd/bwd  [place] on Tandem Airex Tandem airex with ball toss Side stepping on Tandem airex  SLS on airex 11 sec best bilat    [facility] Adult PT Treatment:                                                DATE: 04/24/22 Therapeutic Exercise: Recumbent bike level 3 x 5 minutes  3 way hip 1 x 10 each  Leg press SL 2 x 10 @ 20 lbs   Neuromuscular re-ed: Tandem airex balance beam walks 4 sets d/b fwd/bwd  Therapeutic Activity: Re-assessment to determine overall progress, educating patient on progress towards goals       PATIENT EDUCATION:  Education details: HEP review Person educated: Patient Education method: Explanation Education comprehension: verbalized understanding   HOME EXERCISE PROGRAM: Access Code: [email or web address] URL: [email or web address] Date:  03/11/2022 Prepared by: Letitia Libra   Exercises - Modified Thomas Stretch  - 1 x daily - 7 x weekly - 3 sets - 30 sec hold - Seated Long Arc Quad  - 1 x daily - 7 x weekly - 2 sets - 10 reps - Hooklying Clamshell with Resistance  - 1 x daily - 7 x weekly - 2 sets - 10 reps Added 03/22/22 Banded bridge 10 reps, 2 sets  04/03/22 - Sit to stand with control  - 1 x daily - 7 x weekly - 1-2 sets - 10 reps Added 04/10/22 -- Active straight leg raise  - 1 x daily - 7 x weekly - 1-2 sets - 5-10 reps - Straight Leg Raise with External Rotation  - 1 x daily - 7 x weekly - 1-2 sets - 5-10 reps 04/17/22 - Single Leg Stance on Foam Pad  - 1 x daily - 7 x weekly - 1 sets - 3 reps - 30 hold   ASSESSMENT:   CLINICAL IMPRESSION: Session somewhat limited as patient was late for scheduled visit. Continued with standing strengthening and dynamic  balance activity. With 3 way hip on airex she is noted to have occasional pelvic drop requiring cues to correct having more difficulty correcting during stance on the RLE. She has difficulty maintain SL balance with rotational movement requiring frequent use of stepping strategy to maintain her balance. No reports of knee pain throughout session.    OBJECTIVE IMPAIRMENTS: Abnormal gait, decreased activity tolerance, decreased knowledge of condition, difficulty walking, decreased ROM, decreased strength, improper body mechanics, and pain.    ACTIVITY LIMITATIONS: carrying, lifting, bending, standing, sleeping, and locomotion level   PARTICIPATION LIMITATIONS: meal prep, cleaning, laundry, shopping, and community activity   PERSONAL FACTORS: Age, Fitness, Time since onset of injury/illness/exacerbation, and 1-2 comorbidities: see PMH above  are also affecting patient's functional outcome.    REHAB POTENTIAL: Good   CLINICAL DECISION MAKING: Stable/uncomplicated   EVALUATION COMPLEXITY: Low     GOALS: Goals reviewed with patient? Yes   SHORT TERM GOALS: Target  date: 04/01/2022     Patient will be independent and compliant with initial HEP.    Baseline: issued at eval Goal status: MET   2.  Patient will demonstrate at least 120 degrees of Rt knee flexion AROM to improve ease of sit to stand transfers.  Baseline: see above 04/03/22: 130 Goal status: MET   3.  Patient will complete 5 x STS in </= 12 seconds to improve functional strength.  Baseline: see above  04/03/22: 9.8 sec no pain , no UE assist Goal status: MET      LONG TERM GOALS: Target date: 06/08/2022         Patient will score at least 67% on FOTO to signify clinically meaningful improvement in functional abilities.    Baseline: see above  04/10/22: 67% Goal status: MET   2.  Patient will walk at least 1500 ft without onset of knee pain during 6 MWT.  Baseline: see above 04/10/22: walking 2 miles, no pain  Goal status: MET   3.  Patient will demonstrate 5/5 Rt knee strength to improve gait stability.  Baseline: see above  04/10/22: 5/5 right knee  Goal status: MET    4.  Patient will report pain at worst rated as </= 3/10 to reduce her current functional limitations.  Baseline: see above  04/10/22: 3/10 at most Goal status: MET    5.  Patient will be independent with advanced home program to assist in management of her chronic condition.  Baseline: initial HEP issued  Goal status: ONGOING  6.  Patient will demonstrate 5/5 Rt hip strength to improve stability with prolonged standing activity.  Baseline: see above Goal status: NEW  7.  Patient will maintain SLS on airex for at least 15 seconds without onset of knee pain to improve stability with dancing activity.  Baseline: see above  Goal status: NEW      PLAN:   PT FREQUENCY: 1/week    PT DURATION: 6 weeks   PLANNED INTERVENTIONS: Therapeutic exercises, Therapeutic activity, Neuromuscular re-education, Balance training, Gait training, Patient/Family education, Self Care, Joint mobilization, Aquatic Therapy,  Dry Needling, Cryotherapy, Moist heat, Manual therapy, and Re-evaluation   PLAN FOR NEXT SESSION: progress single leg stability and CKC strengthening as tolerated.    Letitia Libra, PT, DPT, ATC 05/22/22 11:45 AM

## 2022-05-29 ENCOUNTER — Ambulatory Visit: Payer: Medicare Other | Attending: Internal Medicine

## 2022-05-29 DIAGNOSIS — G8929 Other chronic pain: Secondary | ICD-10-CM | POA: Diagnosis not present

## 2022-05-29 DIAGNOSIS — M25561 Pain in right knee: Secondary | ICD-10-CM | POA: Insufficient documentation

## 2022-05-29 DIAGNOSIS — M6281 Muscle weakness (generalized): Secondary | ICD-10-CM | POA: Diagnosis not present

## 2022-05-29 DIAGNOSIS — R262 Difficulty in walking, not elsewhere classified: Secondary | ICD-10-CM

## 2022-05-29 NOTE — Therapy (Signed)
OUTPATIENT PHYSICAL THERAPY TREATMENT NOTE      Patient Name: Sydney Tran MRN: [id number] DOB:1955-12-04, 67 y.o., female Today's Date: 05/29/2022  PCP: Renford Dills, MD   REFERRING PROVIDER: Delfin Gant, MD  END OF SESSION:   PT End of Session - 05/29/22 1109     Visit Number 10    Number of Visits 12    Date for PT Re-Evaluation 06/08/22    Authorization Type MCR    Progress Note Due on Visit 16    PT Start Time 1110   patient late   PT Stop Time 1143    PT Time Calculation (min) 33 min    Activity Tolerance Patient tolerated treatment well    Behavior During Therapy WFL for tasks assessed/performed                 Past Medical History:  Diagnosis Date   Allergy    SEASONAL   Asthma    GERD (gastroesophageal reflux disease)    Hypertension    Sleep apnea    Past Surgical History:  Procedure Laterality Date   TONSILLECTOMY     TUBAL LIGATION     Patient Active Problem List   Diagnosis Date Noted   Dyspnea on exertion 04/14/2016   Upper airway cough syndrome 03/15/2016    REFERRING DIAG: Rt knee OA   THERAPY DIAG:  Chronic pain of right knee  Muscle weakness (generalized)  Difficulty in walking, not elsewhere classified  Rationale for Evaluation and Treatment Rehabilitation  PERTINENT HISTORY: Asthma  Hypertension  PRECAUTIONS: None   WEIGHT BEARING RESTRICTIONS: No  SUBJECTIVE:                                                                                                                                                                                      SUBJECTIVE STATEMENT: "Pretty good."   PAIN:  Are you having pain? none currently NPRS scale: 2 at worst/10 Pain location: Rt knee Pain description: "twinge" Aggravating factors: dance Relieving factors: exercise, water aerobics    OBJECTIVE: (objective measures completed at initial evaluation unless otherwise dated)    DIAGNOSTIC FINDINGS: Rt knee X-ray:  FINDINGS: No evidence of fracture, dislocation, or joint effusion. There is tricompartmental joint space narrowing, sclerosis and osteophytes consistent with degenerative joint disease. No evidence of effusion.   IMPRESSION: Degenerative changes.   PATIENT SURVEYS:  FOTO 61% function to 67% predicted   04/10/22: 67%  COGNITION: Overall cognitive status: Within functional limits for tasks assessed                         SENSATION: Not tested  EDEMA:  No obvious swelling about the knee    MUSCLE LENGTH: Hamstrings: mild tightness bilaterally    POSTURE: No Significant postural limitations   PALPATION: No palpable tenderness about Rt knee    LOWER EXTREMITY ROM:   Active ROM Right eval Left eval Right  04/03/22  Hip flexion       Hip extension       Hip abduction       Hip adduction       Hip internal rotation       Hip external rotation       Knee flexion 114 123 130  Knee extension       Ankle dorsiflexion       Ankle plantarflexion       Ankle inversion       Ankle eversion        (Blank rows = not tested)   LOWER EXTREMITY MMT:   MMT Right eval Left eval Right 04/10/22 04/24/22 Right   Hip flexion 4- 4 4+ 4  Hip extension 4 4  4/5 bilateral   Hip abduction 4 4+  Rt: 4; Lt: 4+   Hip adduction        Hip internal rotation        Hip external rotation        Knee flexion 4- 5 5 5   Knee extension 4 5 4+ 5  Ankle dorsiflexion        Ankle plantarflexion        Ankle inversion        Ankle eversion         (Blank rows = not tested)   LOWER EXTREMITY SPECIAL TESTS:  (+) [place] (-) valgus, varus, anterior drawer, posterior drawer, McMurray's    FUNCTIONIAL TESTS:  5 x STS: 16.6 seconds knee pain ; 04/03/22: 9.8 sec  6 MWT: 1124 ft pain after 4.5 minutes 04/10/22: Left SLS 37 sec, Right 45 sec 04/24/22: SLS on airex RLE 8 seconds "pulling" sensation Rt knee; LLE 11 seconds    GAIT: Distance walked: Assistive device utilized: None Level of  assistance: Complete Independence Comments: Trendelenburg Rt  [facility] Adult PT Treatment:                                                DATE: 05/29/22 Therapeutic Exercise: Recumbent bike level 3 x 5 minutes  Lateral band walks green band at shins 4 x 10 ft  Resisted knee extension x 10 @ 25 lbs   Neuromuscular re-ed: SL ball toss 2 x 10  Airex balance beam walks fwd/bwd 3 sets  Marching on airex 2 x 10     [facility] Adult PT Treatment:                                                DATE: 05/22/22 Therapeutic Exercise: Recumbent bike level 3 x 5 minutes  3 way hip on airex 2 x 10   Neuromuscular re-ed: SL cone tap 6 tall cones; 4 sets each  SL opposite touchdown; 6 tall cones x 10 each    [facility] Adult PT Treatment:  DATE: 05/15/22 Therapeutic Exercise: Recumbent bike level 3 x 5 minutes  Step ups 6 inch x 10 each, step up knee driver x 10 each  Lateral step up 6 inch 2 x 10   Neuromuscular re-ed: Marching on airex 2 x 10  SL ball toss 2 x 10  HEP verbal update to include SL tennis ball toss    [facility] Adult PT Treatment:                                                DATE: 05/01/22 Therapeutic Exercise: Rec Bike L2 x 5 minutes Single leg bridge 10 x 2 each SLR with ER 10 x 2 right  6 inch step up with opp knee drive, 1 set with 1 UE support, 1 set without UE support 6 Inch lateral step up x 10 - light UE support  SL leg press 20# 10 x 1 each   Neuromuscular re-ed: Tandem airex balance beam walks 4 sets d/b fwd/bwd  [place] on Tandem Airex Tandem airex with ball toss Side stepping on Tandem airex  SLS on airex 11 sec best bilat    PATIENT EDUCATION:  Education details: HEP review Person educated: Patient Education method: Explanation Education comprehension: verbalized understanding   HOME EXERCISE PROGRAM: Access Code: [email or web address] URL: [email or web address] Date: 03/11/2022 Prepared by: Letitia Libra    Exercises - Modified Thomas Stretch  - 1 x daily - 7 x weekly - 3 sets - 30 sec hold - Seated Long Arc Quad  - 1 x daily - 7 x weekly - 2 sets - 10 reps - Hooklying Clamshell with Resistance  - 1 x daily - 7 x weekly - 2 sets - 10 reps Added 03/22/22 Banded bridge 10 reps, 2 sets  04/03/22 - Sit to stand with control  - 1 x daily - 7 x weekly - 1-2 sets - 10 reps Added 04/10/22 -- Active straight leg raise  - 1 x daily - 7 x weekly - 1-2 sets - 5-10 reps - Straight Leg Raise with External Rotation  - 1 x daily - 7 x weekly - 1-2 sets - 5-10 reps 04/17/22 - Single Leg Stance on Foam Pad  - 1 x daily - 7 x weekly - 1 sets - 3 reps - 30 hold   ASSESSMENT:   CLINICAL IMPRESSION: Session was limited as patient was late for scheduled visit. Continued emphasis on dynamic balance activity on stable/unstable surface. She has fair postural control during SL dynamic balance activity requiring frequent use of stepping strategy to regain balance. She has better awareness of pelvic drop during SLS activity being able to correct independently. No reports of knee pain throughout session.    OBJECTIVE IMPAIRMENTS: Abnormal gait, decreased activity tolerance, decreased knowledge of condition, difficulty walking, decreased ROM, decreased strength, improper body mechanics, and pain.    ACTIVITY LIMITATIONS: carrying, lifting, bending, standing, sleeping, and locomotion level   PARTICIPATION LIMITATIONS: meal prep, cleaning, laundry, shopping, and community activity   PERSONAL FACTORS: Age, Fitness, Time since onset of injury/illness/exacerbation, and 1-2 comorbidities: see PMH above  are also affecting patient's functional outcome.    REHAB POTENTIAL: Good   CLINICAL DECISION MAKING: Stable/uncomplicated   EVALUATION COMPLEXITY: Low     GOALS: Goals reviewed with patient? Yes   SHORT TERM GOALS: Target date: 04/01/2022     Patient will be  independent and compliant with initial HEP.    Baseline:  issued at eval Goal status: MET   2.  Patient will demonstrate at least 120 degrees of Rt knee flexion AROM to improve ease of sit to stand transfers.  Baseline: see above 04/03/22: 130 Goal status: MET   3.  Patient will complete 5 x STS in </= 12 seconds to improve functional strength.  Baseline: see above  04/03/22: 9.8 sec no pain , no UE assist Goal status: MET      LONG TERM GOALS: Target date: 06/08/2022         Patient will score at least 67% on FOTO to signify clinically meaningful improvement in functional abilities.    Baseline: see above  04/10/22: 67% Goal status: MET   2.  Patient will walk at least 1500 ft without onset of knee pain during 6 MWT.  Baseline: see above 04/10/22: walking 2 miles, no pain  Goal status: MET   3.  Patient will demonstrate 5/5 Rt knee strength to improve gait stability.  Baseline: see above  04/10/22: 5/5 right knee  Goal status: MET    4.  Patient will report pain at worst rated as </= 3/10 to reduce her current functional limitations.  Baseline: see above  04/10/22: 3/10 at most Goal status: MET    5.  Patient will be independent with advanced home program to assist in management of her chronic condition.  Baseline: initial HEP issued  Goal status: ONGOING  6.  Patient will demonstrate 5/5 Rt hip strength to improve stability with prolonged standing activity.  Baseline: see above Goal status: NEW  7.  Patient will maintain SLS on airex for at least 15 seconds without onset of knee pain to improve stability with dancing activity.  Baseline: see above  Goal status: NEW      PLAN:   PT FREQUENCY: 1/week    PT DURATION: 6 weeks   PLANNED INTERVENTIONS: Therapeutic exercises, Therapeutic activity, Neuromuscular re-education, Balance training, Gait training, Patient/Family education, Self Care, Joint mobilization, Aquatic Therapy, Dry Needling, Cryotherapy, Moist heat, Manual therapy, and Re-evaluation   PLAN FOR NEXT  SESSION: progress single leg stability and CKC strengthening as tolerated.    Letitia Libra, PT, DPT, ATC 05/29/22 11:44 AM

## 2022-06-05 ENCOUNTER — Ambulatory Visit: Payer: Medicare Other

## 2022-06-26 ENCOUNTER — Ambulatory Visit: Payer: Medicare Other

## 2022-06-26 DIAGNOSIS — R262 Difficulty in walking, not elsewhere classified: Secondary | ICD-10-CM

## 2022-06-26 DIAGNOSIS — G8929 Other chronic pain: Secondary | ICD-10-CM

## 2022-06-26 DIAGNOSIS — M6281 Muscle weakness (generalized): Secondary | ICD-10-CM

## 2022-06-26 DIAGNOSIS — M25561 Pain in right knee: Secondary | ICD-10-CM | POA: Diagnosis not present

## 2022-06-26 NOTE — Therapy (Signed)
OUTPATIENT PHYSICAL THERAPY TREATMENT NOTE/RE-CERTIFICATION  PHYSICAL THERAPY DISCHARGE SUMMARY  Visits from Start of Care: 11  Current functional level related to goals / functional outcomes: See goals below   Remaining deficits: N/A   Education / Equipment: See education below    Patient agrees to discharge. Patient goals were met. Patient is being discharged due to meeting the stated rehab goals.     Patient Name: Sydney Tran MRN: [id number] DOB:Dec 20, 1955, 67 y.o., female Today's Date: 06/26/2022  PCP: Renford Dills, MD   REFERRING PROVIDER: Delfin Gant, MD  END OF SESSION:   PT End of Session - 06/26/22 1539     Visit Number 11    Number of Visits 12    Date for PT Re-Evaluation --   n/a d/c   Authorization Type MCR    Progress Note Due on Visit 16    PT Start Time 1540   patient late   PT Stop Time 1609    PT Time Calculation (min) 29 min    Activity Tolerance Patient tolerated treatment well    Behavior During Therapy WFL for tasks assessed/performed                  Past Medical History:  Diagnosis Date   Allergy    SEASONAL   Asthma    GERD (gastroesophageal reflux disease)    Hypertension    Sleep apnea    Past Surgical History:  Procedure Laterality Date   TONSILLECTOMY     TUBAL LIGATION     Patient Active Problem List   Diagnosis Date Noted   Dyspnea on exertion 04/14/2016   Upper airway cough syndrome 03/15/2016    REFERRING DIAG: Rt knee OA   THERAPY DIAG:  Chronic pain of right knee  Muscle weakness (generalized)  Difficulty in walking, not elsewhere classified  Rationale for Evaluation and Treatment Rehabilitation  PERTINENT HISTORY: Asthma  Hypertension  PRECAUTIONS: None   WEIGHT BEARING RESTRICTIONS: No  SUBJECTIVE:                                                                                                                                                                                       SUBJECTIVE STATEMENT: "The knee is feeling good. I went to the reunion and danced all I wanted to."  PAIN:  Are you having pain? No    OBJECTIVE: (objective measures completed at initial evaluation unless otherwise dated)    DIAGNOSTIC FINDINGS: Rt knee X-ray: FINDINGS: No evidence of fracture, dislocation, or joint effusion. There is tricompartmental joint space narrowing, sclerosis and osteophytes consistent with degenerative joint disease. No evidence of effusion.   IMPRESSION: Degenerative  changes.   PATIENT SURVEYS:  FOTO 61% function to 67% predicted   04/10/22: 67%  COGNITION: Overall cognitive status: Within functional limits for tasks assessed                         SENSATION: Not tested   EDEMA:  No obvious swelling about the knee    MUSCLE LENGTH: Hamstrings: mild tightness bilaterally    POSTURE: No Significant postural limitations   PALPATION: No palpable tenderness about Rt knee    LOWER EXTREMITY ROM:   Active ROM Right eval Left eval Right  04/03/22  Hip flexion       Hip extension       Hip abduction       Hip adduction       Hip internal rotation       Hip external rotation       Knee flexion 114 123 130  Knee extension       Ankle dorsiflexion       Ankle plantarflexion       Ankle inversion       Ankle eversion        (Blank rows = not tested)   LOWER EXTREMITY MMT:   MMT Right eval Left eval Right 04/10/22 04/24/22 Right  06/26/22   Hip flexion 4- 4 4+ 4 5 bilateral  Hip extension 4 4  4/5 bilateral  5 bilateral  Hip abduction 4 4+  Rt: 4; Lt: 4+  5 bilateral  Hip adduction         Hip internal rotation         Hip external rotation         Knee flexion 4- 5 5 5    Knee extension 4 5 4+ 5   Ankle dorsiflexion         Ankle plantarflexion         Ankle inversion         Ankle eversion          (Blank rows = not tested)   LOWER EXTREMITY SPECIAL TESTS:  (+) [place] (-) valgus, varus, anterior drawer, posterior drawer,  McMurray's    FUNCTIONIAL TESTS:  5 x STS: 16.6 seconds knee pain ; 04/03/22: 9.8 sec  6 MWT: 1124 ft pain after 4.5 minutes 04/10/22: Left SLS 37 sec, Right 45 sec 04/24/22: SLS on airex RLE 8 seconds "pulling" sensation Rt knee; LLE 11 seconds  06/26/22: SLS on airex LLE: 20 seconds; RLE: 24 seconds    GAIT: Distance walked: Assistive device utilized: None Level of assistance: Complete Independence Comments: Trendelenburg Rt  [facility] Adult PT Treatment:                                                DATE: 06/05/22 Therapeutic Exercise: Completed advanced HEP Exercises - Modified Thomas Stretch  - 1 x daily - 7 x weekly - 3 sets - 30 sec hold - Seated Long Arc Quad  - 1 x daily - 3 x weekly - 2 sets - 10 reps - Supine Bridge with Resistance Band  - 1 x daily - 3 x weekly - 2 sets - 10 reps - Sit to stand with control  - 1 x daily - 3 x weekly - 2 sets - 10 reps - Active straight leg raise  -  1 x daily - 3 x weekly - 2 sets - 10 reps - Single Leg Stance on Foam Pad  - 1 x daily - 3 x weekly - 1 sets - 3 reps - 30 hold - Tandem Walking  - 1 x daily - 3 x weekly - 3 sets - 10 reps - Standing Hip Abduction with Resistance at Ankles and Counter Support  - 1 x daily - 3 x weekly - 2 sets - 10 reps - Standing Hip Extension with Resistance at Ankles and Counter Support  - 1 x daily - 3 x weekly - 2 sets - 10 reps - Single Leg Heel Raise with Unilateral Counter Support  - 1 x daily - 3 x weekly - 2 sets - 10 reps  Therapeutic Activity: Re-assessment of goals, educating patient on overall progress     [facility] Adult PT Treatment:                                                DATE: 05/29/22 Therapeutic Exercise: Recumbent bike level 3 x 5 minutes  Lateral band walks green band at shins 4 x 10 ft  Resisted knee extension x 10 @ 25 lbs   Neuromuscular re-ed: SL ball toss 2 x 10  Airex balance beam walks fwd/bwd 3 sets  Marching on airex 2 x 10     [facility] Adult PT Treatment:                                                 DATE: 05/22/22 Therapeutic Exercise: Recumbent bike level 3 x 5 minutes  3 way hip on airex 2 x 10   Neuromuscular re-ed: SL cone tap 6 tall cones; 4 sets each  SL opposite touchdown; 6 tall cones x 10 each   PATIENT EDUCATION:  Education details: see treatment; d/c education  Person educated: Patient Education method: Explanation, demo, handout Education comprehension: verbalized understanding, returned demo    HOME EXERCISE PROGRAM: Access Code: [email or web address] URL: [email or web address]    ASSESSMENT:   CLINICAL IMPRESSION: Taeja has progressed well throughout duration of care reporting an overall improvement in her knee pain and has been able to return to dance classes without issues. She has met all functional goals demonstrating improvements in knee ROM, hip/knee strength, and SL stability. She demonstrates independence with advanced home program and is appropriate for discharge at this time with patient in agreement with this plan.    OBJECTIVE IMPAIRMENTS: Abnormal gait, decreased activity tolerance, decreased knowledge of condition, difficulty walking, decreased ROM, decreased strength, improper body mechanics, and pain.    ACTIVITY LIMITATIONS: carrying, lifting, bending, standing, sleeping, and locomotion level   PARTICIPATION LIMITATIONS: meal prep, cleaning, laundry, shopping, and community activity   PERSONAL FACTORS: Age, Fitness, Time since onset of injury/illness/exacerbation, and 1-2 comorbidities: see PMH above  are also affecting patient's functional outcome.    REHAB POTENTIAL: Good   CLINICAL DECISION MAKING: Stable/uncomplicated   EVALUATION COMPLEXITY: Low     GOALS: Goals reviewed with patient? Yes   SHORT TERM GOALS: Target date: 04/01/2022     Patient will be independent and compliant with initial HEP.    Baseline: issued at eval Goal status: MET   2.  Patient will demonstrate at least 120 degrees of Rt knee  flexion AROM to improve ease of sit to stand transfers.  Baseline: see above 04/03/22: 130 Goal status: MET   3.  Patient will complete 5 x STS in </= 12 seconds to improve functional strength.  Baseline: see above  04/03/22: 9.8 sec no pain , no UE assist Goal status: MET      LONG TERM GOALS: Target date: 06/08/2022         Patient will score at least 67% on FOTO to signify clinically meaningful improvement in functional abilities.    Baseline: see above  04/10/22: 67% Goal status: MET   2.  Patient will walk at least 1500 ft without onset of knee pain during 6 MWT.  Baseline: see above 04/10/22: walking 2 miles, no pain  Goal status: MET   3.  Patient will demonstrate 5/5 Rt knee strength to improve gait stability.  Baseline: see above  04/10/22: 5/5 right knee  Goal status: MET    4.  Patient will report pain at worst rated as </= 3/10 to reduce her current functional limitations.  Baseline: see above  04/10/22: 3/10 at most Goal status: MET    5.  Patient will be independent with advanced home program to assist in management of her chronic condition.  Baseline: initial HEP issued  Goal status: met  6.  Patient will demonstrate 5/5 Rt hip strength to improve stability with prolonged standing activity.  Baseline: see above Goal status: met  7.  Patient will maintain SLS on airex for at least 15 seconds without onset of knee pain to improve stability with dancing activity.  Baseline: see above  Goal status: met      PLAN:   PT FREQUENCY: n/a   PT DURATION: n/a   PLANNED INTERVENTIONS: Therapeutic exercises, Therapeutic activity, Neuromuscular re-education, Balance training, Gait training, Patient/Family education, Self Care, Joint mobilization, Aquatic Therapy, Dry Needling, Cryotherapy, Moist heat, Manual therapy, and Re-evaluation   PLAN FOR NEXT SESSION: n/a   Letitia Libra, PT, DPT, ATC 06/26/22 4:10 PM

## 2022-07-03 ENCOUNTER — Telehealth: Payer: Self-pay

## 2022-07-03 NOTE — Telephone Encounter (Signed)
ALH 

## 2022-09-17 DIAGNOSIS — M8580 Other specified disorders of bone density and structure, unspecified site: Secondary | ICD-10-CM | POA: Diagnosis not present

## 2022-09-17 DIAGNOSIS — Z5181 Encounter for therapeutic drug level monitoring: Secondary | ICD-10-CM | POA: Diagnosis not present

## 2022-09-17 DIAGNOSIS — I10 Essential (primary) hypertension: Secondary | ICD-10-CM | POA: Diagnosis not present

## 2022-09-17 DIAGNOSIS — Z0000 Encounter for general adult medical examination without abnormal findings: Secondary | ICD-10-CM | POA: Diagnosis not present

## 2022-12-23 DIAGNOSIS — L438 Other lichen planus: Secondary | ICD-10-CM | POA: Diagnosis not present

## 2022-12-23 DIAGNOSIS — L258 Unspecified contact dermatitis due to other agents: Secondary | ICD-10-CM | POA: Diagnosis not present

## 2022-12-24 DIAGNOSIS — H43393 Other vitreous opacities, bilateral: Secondary | ICD-10-CM | POA: Diagnosis not present

## 2023-02-12 DIAGNOSIS — J069 Acute upper respiratory infection, unspecified: Secondary | ICD-10-CM | POA: Diagnosis not present

## 2023-04-07 DIAGNOSIS — J069 Acute upper respiratory infection, unspecified: Secondary | ICD-10-CM | POA: Diagnosis not present

## 2023-04-07 DIAGNOSIS — Z03818 Encounter for observation for suspected exposure to other biological agents ruled out: Secondary | ICD-10-CM | POA: Diagnosis not present

## 2023-04-29 DIAGNOSIS — J069 Acute upper respiratory infection, unspecified: Secondary | ICD-10-CM | POA: Diagnosis not present

## 2023-04-29 DIAGNOSIS — R197 Diarrhea, unspecified: Secondary | ICD-10-CM | POA: Diagnosis not present

## 2023-08-28 DIAGNOSIS — I10 Essential (primary) hypertension: Secondary | ICD-10-CM | POA: Diagnosis not present

## 2023-09-28 DIAGNOSIS — I10 Essential (primary) hypertension: Secondary | ICD-10-CM | POA: Diagnosis not present

## 2023-10-28 DIAGNOSIS — I10 Essential (primary) hypertension: Secondary | ICD-10-CM | POA: Diagnosis not present

## 2024-01-07 ENCOUNTER — Other Ambulatory Visit: Payer: Self-pay | Admitting: Internal Medicine

## 2024-01-07 DIAGNOSIS — Z1231 Encounter for screening mammogram for malignant neoplasm of breast: Secondary | ICD-10-CM

## 2024-02-04 ENCOUNTER — Ambulatory Visit
Admission: RE | Admit: 2024-02-04 | Discharge: 2024-02-04 | Disposition: A | Discharge status: Home / Self Care | Source: Ambulatory Visit | Attending: Internal Medicine | Admitting: Internal Medicine

## 2024-02-04 DIAGNOSIS — Z1231 Encounter for screening mammogram for malignant neoplasm of breast: Secondary | ICD-10-CM
